# Patient Record
Sex: Male | Born: 1990 | Race: White | Hispanic: No | Marital: Single | Smoking: Former smoker
Health system: Southern US, Community
[De-identification: ages and names within clinical notes are randomized; demographics above are authoritative.]

## PROBLEM LIST (undated history)

## (undated) HISTORY — PX: NO PAST SURGERIES: SHX2092

## (undated) HISTORY — PX: APPENDECTOMY: SHX54

---
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
## 2014-10-31 DIAGNOSIS — R51 Headache: Secondary | ICD-10-CM

## 2014-10-31 DIAGNOSIS — R519 Headache, unspecified: Secondary | ICD-10-CM | POA: Insufficient documentation

## 2014-12-26 DIAGNOSIS — G43109 Migraine with aura, not intractable, without status migrainosus: Secondary | ICD-10-CM | POA: Insufficient documentation

## 2014-12-26 DIAGNOSIS — R55 Syncope and collapse: Secondary | ICD-10-CM | POA: Insufficient documentation

## 2014-12-28 ENCOUNTER — Encounter: Payer: Self-pay | Admitting: Family Medicine

## 2014-12-28 ENCOUNTER — Ambulatory Visit (INDEPENDENT_AMBULATORY_CARE_PROVIDER_SITE_OTHER): Payer: 59 | Admitting: Family Medicine

## 2014-12-28 VITALS — BP 90/60 | HR 78 | Temp 98.2°F | Resp 19 | Wt 140.2 lb

## 2014-12-28 DIAGNOSIS — Z024 Encounter for examination for driving license: Secondary | ICD-10-CM

## 2014-12-28 DIAGNOSIS — L409 Psoriasis, unspecified: Secondary | ICD-10-CM | POA: Diagnosis not present

## 2014-12-28 NOTE — Patient Instructions (Signed)
Psoriasis Psoriasis is a common, long-lasting (chronic) inflammation of the skin. It affects both men and women equally, of all ages and all races. Psoriasis cannot be passed from person to person (not contagious). Psoriasis varies from mild to very severe. When severe, it can greatly affect your quality of life. Psoriasis is an inflammatory disorder affecting the skin as well as other organs including the joints (causing an arthritis). With psoriasis, the skin sheds its top layer of cells more rapidly than it does in someone without psoriasis. CAUSES  The cause of psoriasis is largely unknown. Genetics, your immune system, and the environment seem to play a role in causing psoriasis. Factors that can make psoriasis worse include:  Damage or trauma to the skin, such as cuts, scrapes, and sunburn. This damage often causes new areas of psoriasis (lesions).  Winter dryness and lack of sunlight.  Medicines such as lithium, beta-blockers, antimalarial drugs, ACE inhibitors, nonsteroidal anti-inflammatory drugs (ibuprofen, aspirin), and terbinafine. Let your caregiver know if you are taking any of these drugs.  Alcohol. Excessive alcohol use should be avoided if you have psoriasis. Drinking large amounts of alcohol can affect:  How well your psoriasis treatment works.  How safe your psoriasis treatment is.  Smoking. If you smoke, ask your caregiver for help to quit.  Stress.  Bacterial or viral infections.  Arthritis. Arthritis associated with psoriasis (psoriatic arthritis) affects less than 10% of patients with psoriasis. The arthritic intensity does not always match the skin psoriasis intensity. It is important to let your caregiver know if your joints hurt or if they are stiff. SYMPTOMS  The most common form of psoriasis begins with little red bumps that gradually become larger. The bumps begin to form scales that flake off easily. The lower layers of scales stick together. When these scales  are scratched or removed, the underlying skin is tender and bleeds easily. These areas then grow in size and may become large. Psoriasis often creates a rash that looks the same on both sides of the body (symmetrical). It often affects the elbows, knees, groin, genitals, arms, legs, scalp, and nails. Affected nails often have pitting, loosen, thicken, crumble, and are difficult to treat.  "Inverse psoriasis"occurs in the armpits, under breasts, in skin folds, and around the groin, buttocks, and genitals.  "Guttate psoriasis" generally occurs in children and young adults following a recent sore throat (strep throat). It begins with many small, red, scaly spots on the skin. It clears spontaneously in weeks or a few months without treatment. DIAGNOSIS  Psoriasis is diagnosed by physical exam. A tissue sample (biopsy) may also be taken. TREATMENT The treatment of psoriasis depends on your age, health, and living conditions.  Steroid (cortisone) creams, lotions, and ointments may be used. These treatments are associated with thinning of the skin, blood vessels that get larger (dilated), loss of skin pigmentation, and easy bruising. It is important to use these steroids as directed by your caregiver. Only treat the affected areas and not the normal, unaffected skin. People on long-term steroid treatment should wear a medical alert bracelet. Injections may be used in areas that are difficult to treat.  Scalp treatments are available as shampoos, solutions, sprays, foams, and oils. Avoid scratching the scalp and picking at the scales.  Anthralin medicine works well on areas that are difficult to treat. However, it stains clothes and skin and may cause temporary irritation.  Synthetic vitamin D (calcipotriene)can be used on small areas. It is available by prescription. The forms   of synthetic vitamin D available in health food stores do not help with psoriasis.  Coal tarsare available in various strengths  for psoriasis that is difficult to treat. They are one of the longest used treatments for difficult to treat psoriasis. However, they are messy to use.  Light therapy (UV therapy) can be carefully and professionally monitored in a dermatologist's office. Careful sunbathing is helpful for many people as directed by your caregiver. The exposure should be just long enough to cause a mild redness (erythema) of your skin. Avoid sunburn as this may make the condition worse. Sunscreen (SPF of 30 or higher) should be used to protect against sunburn. Cataracts, wrinkles, and skin aging are some of the harmful side effects of light therapy.  If creams (topical medicines) fail, there are several other options for systemic or oral medicines your caregiver can suggest. Psoriasis can sometimes be very difficult to treat. It can come and go. It is necessary to follow up with your caregiver regularly if your psoriasis is difficult to treat. Usually, with persistence you can get a good amount of relief. Maintaining consistent care is important. Do not change caregivers just because you do not see immediate results. It may take several trials to find the right combination of treatment for you. PREVENTING FLARE-UPS  Wear gloves while you wash dishes, while cleaning, and when you are outside in the cold.  If you have radiators, place a bowl of water or damp towel on the radiator. This will help put water back in the air. You can also use a humidifier to keep the air moist. Try to keep the humidity at about 60% in your home.  Apply moisturizer while your skin is still damp from bathing or showering. This traps water in the skin.  Avoid long, hot baths or showers. Keep soap use to a minimum. Soaps dry out the skin and wash away the protective oils. Use a fragrance free, dye free soap.  Drink enough water and fluids to keep your urine clear or pale yellow. Not drinking enough water depletes your skin's water  supply.  Turn off the heat at night and keep it low during the day. Cool air is less drying. SEEK MEDICAL CARE IF:  You have increasing pain in the affected areas.  You have uncontrolled bleeding in the affected areas.  You have increasing redness or warmth in the affected areas.  You start to have pain or stiffness in your joints.  You start feeling depressed about your condition.  You have a fever. Document Released: 04/25/2000 Document Revised: 07/21/2011 Document Reviewed: 10/21/2010 ExitCare Patient Information 2015 [employer]. This information is not intended to replace advice given to you by your health care provider. Make sure you discuss any questions you have with your health care provider.  
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
## 2018-08-16 ENCOUNTER — Telehealth: Payer: Self-pay | Admitting: *Deleted

## 2018-08-16 ENCOUNTER — Ambulatory Visit: Payer: Self-pay | Admitting: Family Medicine

## 2018-08-16 NOTE — Telephone Encounter (Signed)
States his exposure was when [place] and found out one of the girls was tested positive for COVID-19 on 08-12-18. He was advised to isolate for 14 days. He has not had any fever, cough or shortness of breath since his exposure 8 days ago. States his employer is very anxious and wanted him tested. He does not meet the criteria for testing but should continue isolation and use a face covering when he leaves his home.

## 2018-08-16 NOTE — Telephone Encounter (Signed)
In contact Sunday before with a group of 4.   [place] the [place] and were [place] a [place].   Later in the week one of the ladies on the boat with [place].    She got tested for coronavirus and is positive.  I have no symptoms.   I let my employer know I had been in contact with someone.   They want follow me to follow the CDC guidelines and now they want me to get tested.  When I asked him about who he sees for his PCP I noticed it was Dortha Kern, PA with a different practice that we don't answer for.   He said he called [facility] and they gave him this number.   So I instructed him to call the practice he normally goes to with Dortha Kern, PA.   He said he would do that.     I did answer his questions and let him know about staying home and isolating for 14 days but he still needed to contact his PCP.   He verbalized understanding and was agreeable to all his PCP.

## 2018-08-16 NOTE — Telephone Encounter (Signed)
Patient would like Maurine Minister to call him concerning [phone] testing. Patient states he was exposed to the virus and now his employer will not let him come back to work. Please advise? Cb# [phone]

## 2018-08-16 NOTE — Telephone Encounter (Signed)
Patient returned call. Patient was at home when exposed.

## 2018-08-16 NOTE — Telephone Encounter (Signed)
No answer. Left message to call back. With this history, he must stay home from work, use a face covering when leaving the home going to the grocery store and notify us if he is having fever above 100, cough and shortness of breath. He should wash his hands frequently for 20 seconds each time. Need to know if his exposure was at work or at home.

## 2018-08-23 ENCOUNTER — Telehealth: Payer: Self-pay

## 2018-08-23 ENCOUNTER — Other Ambulatory Visit: Payer: Self-pay

## 2018-08-23 ENCOUNTER — Ambulatory Visit: Payer: 59 | Admitting: Family Medicine

## 2018-08-23 ENCOUNTER — Encounter: Payer: Self-pay | Admitting: Family Medicine

## 2018-08-23 VITALS — BP 110/72 | HR 96 | Temp 99.1°F | Resp 16 | Ht 68.0 in | Wt 145.0 lb

## 2018-08-23 DIAGNOSIS — Z20822 Contact with and (suspected) exposure to covid-19: Secondary | ICD-10-CM

## 2018-08-23 DIAGNOSIS — Z20828 Contact with and (suspected) exposure to other viral communicable diseases: Secondary | ICD-10-CM

## 2018-08-23 DIAGNOSIS — L409 Psoriasis, unspecified: Secondary | ICD-10-CM | POA: Diagnosis not present

## 2018-08-23 NOTE — Telephone Encounter (Signed)
Schedule appointment to document temperature since he has not been seen since 2016.

## 2018-08-23 NOTE — Telephone Encounter (Signed)
Patient called and stated that he needs a doctor notes for work. He states that the job told him he could not come back to work until he is cleared from a doctor due to not be able to get the COVID-19 test done.
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
## 2018-08-23 NOTE — Telephone Encounter (Signed)
Done

## 2019-03-03 ENCOUNTER — Emergency Department: Payer: 59

## 2019-03-03 ENCOUNTER — Encounter: Payer: Self-pay | Admitting: Emergency Medicine

## 2019-03-03 ENCOUNTER — Other Ambulatory Visit: Payer: Self-pay

## 2019-03-03 ENCOUNTER — Inpatient Hospital Stay
Admission: EM | Admit: 2019-03-03 | Discharge: 2019-03-05 | DRG: 494 | Disposition: A | Discharge status: Home / Self Care | Payer: 59 | Attending: Orthopedic Surgery | Admitting: Orthopedic Surgery

## 2019-03-03 DIAGNOSIS — Z87891 Personal history of nicotine dependence: Secondary | ICD-10-CM

## 2019-03-03 DIAGNOSIS — W109XXA Fall (on) (from) unspecified stairs and steps, initial encounter: Secondary | ICD-10-CM | POA: Diagnosis present

## 2019-03-03 DIAGNOSIS — S82831A Other fracture of upper and lower end of right fibula, initial encounter for closed fracture: Secondary | ICD-10-CM | POA: Diagnosis present

## 2019-03-03 DIAGNOSIS — S82241A Displaced spiral fracture of shaft of right tibia, initial encounter for closed fracture: Principal | ICD-10-CM | POA: Diagnosis present

## 2019-03-03 DIAGNOSIS — Y92009 Unspecified place in unspecified non-institutional (private) residence as the place of occurrence of the external cause: Secondary | ICD-10-CM | POA: Diagnosis not present

## 2019-03-03 DIAGNOSIS — M25539 Pain in unspecified wrist: Secondary | ICD-10-CM

## 2019-03-03 DIAGNOSIS — Z20828 Contact with and (suspected) exposure to other viral communicable diseases: Secondary | ICD-10-CM | POA: Diagnosis present

## 2019-03-03 DIAGNOSIS — S82201A Unspecified fracture of shaft of right tibia, initial encounter for closed fracture: Secondary | ICD-10-CM | POA: Diagnosis present

## 2019-03-03 DIAGNOSIS — Z419 Encounter for procedure for purposes other than remedying health state, unspecified: Secondary | ICD-10-CM

## 2019-03-03 DIAGNOSIS — S82451A Displaced comminuted fracture of shaft of right fibula, initial encounter for closed fracture: Secondary | ICD-10-CM | POA: Diagnosis present

## 2019-03-03 LAB — PROTIME-INR
INR: 1.1 (ref 0.8–1.2)
Prothrombin Time: 14.1 seconds (ref 11.4–15.2)

## 2019-03-03 LAB — COMPREHENSIVE METABOLIC PANEL
ALT: 15 U/L (ref 0–44)
AST: 20 U/L (ref 15–41)
Albumin: 4.5 g/dL (ref 3.5–5.0)
Alkaline Phosphatase: 54 U/L (ref 38–126)
Anion gap: 11 (ref 5–15)
BUN: 11 mg/dL (ref 6–20)
CO2: 26 mmol/L (ref 22–32)
Calcium: 9.2 mg/dL (ref 8.9–10.3)
Chloride: 106 mmol/L (ref 98–111)
Creatinine, Ser: 1.01 mg/dL (ref 0.61–1.24)
GFR calc Af Amer: >60 mL/min (ref >60)
GFR calc non Af Amer: >60 mL/min (ref >60)
Glucose, Bld: 124 mg/dL — ABNORMAL HIGH (ref 70–99)
Potassium: 3.7 mmol/L (ref 3.5–5.1)
Sodium: 143 mmol/L (ref 135–145)
Total Bilirubin: 0.4 mg/dL (ref 0.3–1.2)
Total Protein: 7.4 g/dL (ref 6.5–8.1)

## 2019-03-03 LAB — CBC WITH DIFFERENTIAL/PLATELET
Abs Immature Granulocytes: 0.03 10*3/uL (ref 0.00–0.07)
Basophils Absolute: 0.1 10*3/uL (ref 0.0–0.1)
Basophils Relative: 1 %
Eosinophils Absolute: 0.2 10*3/uL (ref 0.0–0.5)
Eosinophils Relative: 2 %
HCT: 40.7 % (ref 39.0–52.0)
Hemoglobin: 13.6 g/dL (ref 13.0–17.0)
Immature Granulocytes: 0 %
Lymphocytes Relative: 39 %
Lymphs Abs: 3.9 10*3/uL (ref 0.7–4.0)
MCH: 31.7 pg (ref 26.0–34.0)
MCHC: 33.4 g/dL (ref 30.0–36.0)
MCV: 94.9 fL (ref 80.0–100.0)
Monocytes Absolute: 0.8 10*3/uL (ref 0.1–1.0)
Monocytes Relative: 9 %
Neutro Abs: 4.9 10*3/uL (ref 1.7–7.7)
Neutrophils Relative %: 49 %
Platelets: 364 10*3/uL (ref 150–400)
RBC: 4.29 MIL/uL (ref 4.22–5.81)
RDW: 12.1 % (ref 11.5–15.5)
WBC: 9.9 10*3/uL (ref 4.0–10.5)
nRBC: 0 % (ref 0.0–0.2)

## 2019-03-03 LAB — TYPE AND SCREEN
ABO/RH(D): A POS
Antibody Screen: NEGATIVE

## 2019-03-03 LAB — SARS CORONAVIRUS 2 BY RT PCR (HOSPITAL ORDER, PERFORMED IN ~~LOC~~ HOSPITAL LAB): SARS Coronavirus 2: NEGATIVE

## 2019-03-03 MED ORDER — SODIUM CHLORIDE 0.9 % IV BOLUS
1000.0000 mL | Freq: Once | INTRAVENOUS | Status: AC
  Administered 2019-03-03: 1000 mL via INTRAVENOUS

## 2019-03-03 MED ORDER — MORPHINE SULFATE (PF) 2 MG/ML IV SOLN
2.0000 mg | Freq: Once | INTRAVENOUS | Status: AC
  Administered 2019-03-03: 2 mg via INTRAVENOUS
  Filled 2019-03-03: qty 1

## 2019-03-03 MED ORDER — ONDANSETRON HCL 4 MG/2ML IJ SOLN
4.0000 mg | Freq: Once | INTRAMUSCULAR | Status: AC
  Administered 2019-03-03: 4 mg via INTRAVENOUS
  Filled 2019-03-03: qty 2

## 2019-03-03 MED ORDER — OXYCODONE-ACETAMINOPHEN 5-325 MG PO TABS
1.0000 | ORAL_TABLET | Freq: Once | ORAL | Status: DC
  Filled 2019-03-03: qty 1

## 2019-03-03 MED ORDER — MORPHINE SULFATE (PF) 4 MG/ML IV SOLN
4.0000 mg | Freq: Once | INTRAVENOUS | Status: AC
  Administered 2019-03-03: 4 mg via INTRAVENOUS
  Filled 2019-03-03: qty 1

## 2019-03-03 MED ORDER — TETANUS-DIPHTH-ACELL PERTUSSIS 5-2.5-18.5 LF-MCG/0.5 IM SUSP
0.5000 mL | Freq: Once | INTRAMUSCULAR | Status: AC
  Administered 2019-03-03: 0.5 mL via INTRAMUSCULAR
  Filled 2019-03-03: qty 0.5

## 2019-03-03 MED ORDER — OXYCODONE-ACETAMINOPHEN 5-325 MG PO TABS: 1.0000 | ORAL_TABLET | Freq: Four times a day (QID) | ORAL | 0 refills | Status: DC | PRN

## 2019-03-03 NOTE — ED Notes (Signed)
Report off to rebecca rn   Pt moved to room 30.

## 2019-03-03 NOTE — Consult Note (Signed)
Full consult note to follow. Plan right tibia intramedullary fixation Friday afternoon based on OR availability. Pain control, monitor neurovascular status and NPO.

## 2019-03-03 NOTE — ED Provider Notes (Signed)
[facility] Emergency Department Provider Note  ____________________________________________  Time seen: Approximately 9:53 PM  I have reviewed the triage vital signs and the nursing notes.   HISTORY  Chief Complaint Leg Injury and Foot Injury    HPI Dale Harmon is a 28 y.o. male who presents the emergency department complaining of right ankle injury.  Patient reports  that he was partying with some of his friends tonight when he was walking down a stairway, missed a step, that his ankle "snapped."  Patient reports that he fell landing on his right shoulder.  Patient states that he had intense pain in his ankle but he tried to stand up, and then realized that his foot was canted over to the right while his leg was straight up.  Patient was trying to move and he noticed that his lower leg was moving while his foot was not.  At that point patient did not try to get up and was assisted to a vehicle by his friends and brought to the emergency department.  Patient reports that he has had some alcohol tonight as well as a full meal approximately 3-1/2 hours prior to arrival.  He did not hit his head or lose consciousness.  Patient does have an abrasion reported to the right shoulder but denies any other injury other than ankle and abrasion.        History reviewed. No pertinent past medical history.  Patient Active Problem List   Diagnosis Date Noted  . Psoriasis 12/28/2014  . Acute onset aura migraine 12/26/2014  . Episode of syncope 12/26/2014  . Cephalalgia 10/31/2014    Past Surgical History:  Procedure Laterality Date  . NO PAST SURGERIES      Prior to Admission medications   Medication Sig Start Date End Date Taking? Authorizing Provider  oxyCODONE-acetaminophen (PERCOCET/ROXICET) 5-325 MG tablet Take 1 tablet by mouth every 6 (six) hours as needed for severe pain. 03/03/19   Nak[place] Ganus, Charline Bills, PA-C  SUMAtriptan (IMITREX) 100 MG tablet Take 1/2  to 1 tablet at headache onset. May take a second dose after 2 hours if needed. 10/31/14   [provider]    Allergies Amoxicillin and Penicillins  Family History  Problem Relation Age of Onset  . Healthy Mother   . Healthy Father     Social History Social History   Tobacco Use  . Smoking status: Former Smoker    Quit date: 11/10/2014    Years since quitting: 4.3  . Smokeless tobacco: Never Used  Substance Use Topics  . Alcohol use: Yes    Alcohol/week: 0.0 standard drinks    Comment: MODERATE USE  . Drug use: No     Review of Systems  Constitutional: No fever/chills Eyes: No visual changes. No discharge ENT: No upper respiratory complaints. Cardiovascular: no chest pain. Respiratory: no cough. No SOB. Gastrointestinal: No abdominal pain.  No nausea, no vomiting.  Musculoskeletal: Positive for significant ankle injury Skin: Negative for rash, abrasions, lacerations, ecchymosis. Neurological: Negative for headaches, focal weakness or numbness. 10-point ROS otherwise negative.  ____________________________________________   PHYSICAL EXAM:  VITAL SIGNS: ED Triage Vitals  Enc Vitals Group     BP      Pulse      Resp      Temp      Temp src      SpO2      Weight      Height      Head Circumference  Peak Flow      Pain Score      Pain Loc      Pain Edu?      Excl. in GC?      Constitutional: Alert and oriented. Well appearing and in no acute distress. Eyes: Conjunctivae are normal. PERRL. EOMI. Head: Atraumatic. Neck: No stridor.  No cervical spine tenderness to palpation.  Cardiovascular: Normal rate, regular rhythm. Normal S1 and S2.  Good peripheral circulation. Respiratory: Normal respiratory effort without tachypnea or retractions. Lungs CTAB. Good air entry to the bases with no decreased or absent breath sounds. Musculoskeletal: Full range of motion to all extremities. No gross deformities appreciated.  Visualization of the right ankle  reveals obvious deformity.  Patient right lower extremity is in anatomical position with the right foot canted over to the right.  Initially, palpation revealed that this foot was much cooler than the other extremity.  Throughout manipulation for imaging, patient did have slight traction placed on his foot and his foot became much warmer.  Patient reports that he has decreased sensation globally to the foot distal to the obvious deformity.  With any movement, whether movement by staff to place the patient in position for imaging or the patient shifting in the bed, obvious tenting of the skin occurs proximal to the ankle joint.  Patient has exquisite pain in this region and has exquisite tenderness to any palpation.  Dorsalis pedis pulse is appreciated and marked with indelible marker. Neurologic:  Normal speech and language. No gross focal neurologic deficits are appreciated.  Skin:  Skin is warm, dry and intact. No rash noted. Psychiatric: Mood and affect are normal. Speech and behavior are normal. Patient exhibits appropriate insight and judgement.   ____________________________________________   LABS (all labs ordered are listed, but only abnormal results are displayed)  Labs Reviewed  COMPREHENSIVE METABOLIC PANEL - Abnormal; Notable for the following components:      Result Value   Glucose, Bld 124 (*)    All other components within normal limits  SARS CORONAVIRUS 2 BY RT PCR (HOSPITAL ORDER, PERFORMED IN [facility])  CBC WITH DIFFERENTIAL/PLATELET  PROTIME-INR  TYPE AND SCREEN   ____________________________________________  EKG   ____________________________________________  RADIOLOGY I personally viewed and evaluated these images as part of my medical decision making, as well as reviewing the written report by the radiologist.  Dg Chest 1 View  Result Date: 03/03/2019 CLINICAL DATA:  Ankle injury EXAM: CHEST  1 VIEW COMPARISON:  None. FINDINGS: Rotated patient.  No focal airspace disease or pleural effusion. Heart size within normal limits. No pneumothorax. IMPRESSION: No active disease.  Rotated patient Electronically Signed   By: Jasmine PangKim  Fujinaga M.D.   On: 03/03/2019 22:34   Dg Tibia/fibula Right  Result Date: 03/03/2019 CLINICAL DATA:  Injury EXAM: RIGHT TIBIA AND FIBULA - 2 VIEW COMPARISON:  None. FINDINGS: Acute comminuted fracture involving the proximal shaft of the fibula at the junction of the proximal and middle thirds. 1/2 shaft diameter medial and posterior displacement of the main fracture fragment. Incompletely visualized displaced spiral fracture of the distal tibia. IMPRESSION: 1. Acute comminuted and displaced fracture involving proximal shaft of the fibula 2. Incompletely visualized displaced fracture of the distal tibia Electronically Signed   By: Jasmine PangKim  Fujinaga M.D.   On: 03/03/2019 22:35   Dg Ankle Complete Right  Result Date: 03/03/2019 CLINICAL DATA:  Injury EXAM: RIGHT ANKLE - COMPLETE 3+ VIEW COMPARISON:  None. FINDINGS: Incompletely visualized fracture of the proximal  to mid shaft of fibula. Spiral fracture distal shaft of the tibia with slightly greater than 1/2 shaft diameter lateral, and about 1/4 shaft diameter of anterior displacement of distal fracture fragment. IMPRESSION: Acute displaced spiral fracture of the distal tibia. Incompletely visualized fracture involving the proximal to midshaft of the fibula Electronically Signed   By: Jasmine Pang M.D.   On: 03/03/2019 22:34    ____________________________________________    PROCEDURES  Procedure(s) performed:    .Splint Application  Date/Time: 03/03/2019 10:32 PM Performed by: Racheal Patches, PA-C Authorized by: Racheal Patches, PA-C   Consent:    Consent obtained:  Verbal   Consent given by:  Patient   Risks discussed:  Pain, swelling and numbness Pre-procedure details:    Sensation:  Numbness (Comparing sensation between the right lower extremity  left lower extremity.  Patient is able to fill each digit individually, however there is decreased sensation when compared with unaffected extremity.  ) Procedure details:    Laterality:  Right   Location:  Ankle   Ankle:  R ankle   Splint type:  Short leg and ankle stirrup   Supplies:  Cotton padding, Ortho-Glass and elastic bandage Post-procedure details:    Pain:  Improved   Sensation:  Numbness (As noted above, slight decrease in sensation on the right foot when compared with left.  Patient is still able to fill each individual toe with palpation.  No change from prior to splint application.)   Patient tolerance of procedure:  Tolerated well, no immediate complications      Medications  oxyCODONE-acetaminophen (PERCOCET/ROXICET) 5-325 MG per tablet 1 tablet (has no administration in time range)  Tdap (BOOSTRIX) injection 0.5 mL (0.5 mLs Intramuscular Given 03/03/19 2221)  morphine 4 MG/ML injection 4 mg (4 mg Intravenous Given 03/03/19 2223)  ondansetron (ZOFRAN) injection 4 mg (4 mg Intravenous Given 03/03/19 2223)  sodium chloride 0.9 % bolus 1,000 mL (1,000 mLs Intravenous New Bag/Given 03/03/19 2223)     ____________________________________________   INITIAL IMPRESSION / ASSESSMENT AND PLAN / ED COURSE  Pertinent labs & imaging results that were available during my care of the patient were reviewed by me and considered in my medical decision making (see chart for details).  Review of the [place] CSRS was performed in accordance of the [facility] prior to dispensing any controlled drugs.           Patient's diagnosis is consistent with fall resulting in comminuted fibular fracture and displaced spiral fracture of the tibia.  Patient presented to the emergency department after mechanical fall causing an injury to his right lower extremity. Initial presentation to the room, patient had an obvious deformity with an obviously unstable fracture.  Patient moved, his foot remained canted  to the right while his proximal tibial region moved independently.  Initially patient's foot was cool to the touch.  However during positioning for imaging, patient stated that it "felt better" as well as starting to warm up.  Patient has had a good dorsalis pedis pulse and good capillary refill since this time.  Imaging revealed the above fractures.  Patient was splinted with posterior OCL and stirrup ankle splint.  Patient has been given IV pain medications here in the emergency department.  Discussed the case with on-call orthopedic surgeon.  Patient was advised that he could either be admitted and have surgery tomorrow late in the afternoon or schedule outpatient follow-up first thing Monday for surgery.  Patient opted to go home tonight, will follow up  with orthopedic surgery first thing Monday morning to schedule surgery.  Return precautions are discussed with the patient.  Patient is prescribed Percocet for pain.  Patient is given ED precautions to return to the ED for any worsening or new symptoms.     ____________________________________________  FINAL CLINICAL IMPRESSION(S) / ED DIAGNOSES  Final diagnoses:  Closed displaced spiral fracture of shaft of right tibia, initial encounter  Closed displaced comminuted fracture of shaft of right fibula, initial encounter      NEW MEDICATIONS STARTED DURING THIS VISIT:  ED Discharge Orders         Ordered    oxyCODONE-acetaminophen (PERCOCET/ROXICET) 5-325 MG tablet  Every 6 hours PRN,   Status:  Discontinued     03/03/19 2320    oxyCODONE-acetaminophen (PERCOCET/ROXICET) 5-325 MG tablet  Every 6 hours PRN     03/03/19 2322              This chart was dictated using voice recognition software/Dragon. Despite best efforts to proofread, errors can occur which can change the meaning. Any change was purely unintentional.    Racheal Patches, PA-C 03/03/19 2325    Phineas Semen, MD 03/07/19 [phone]

## 2019-03-03 NOTE — ED Triage Notes (Signed)
Pt with obvious deformity to the right lower leg/ankle post fall down stairs. Pt admits to ETOH. Pt is A&O x4 on arrival. PA at the bedside.

## 2019-03-03 NOTE — ED Notes (Signed)
Pt reports twisted right ankle going down the steps tonight.  Pt felt it pop.  Obvious deformity noted.   Pt states etoh use tonight.   Good distal pulses.  Pt alert.

## 2019-03-03 NOTE — ED Notes (Signed)
ocl splint applied to right ankle by pac and rn.  Iv infusing and meds given.  Labs sent.

## 2019-03-04 ENCOUNTER — Inpatient Hospital Stay: Payer: 59 | Admitting: Anesthesiology

## 2019-03-04 ENCOUNTER — Encounter
Admission: EM | Disposition: A | Discharge status: Home / Self Care | Payer: Self-pay | Source: Home / Self Care | Attending: Orthopedic Surgery

## 2019-03-04 ENCOUNTER — Inpatient Hospital Stay: Payer: 59

## 2019-03-04 ENCOUNTER — Encounter: Payer: Self-pay | Admitting: *Deleted

## 2019-03-04 HISTORY — PX: TIBIA IM NAIL INSERTION: SHX2516

## 2019-03-04 LAB — MRSA PCR SCREENING: MRSA by PCR: NEGATIVE

## 2019-03-04 LAB — HIV ANTIBODY (ROUTINE TESTING W REFLEX): HIV Screen 4th Generation wRfx: NONREACTIVE

## 2019-03-04 SURGERY — INSERTION, INTRAMEDULLARY ROD, TIBIA
Anesthesia: General | Site: Leg Lower | Laterality: Right

## 2019-03-04 MED ORDER — ROPIVACAINE HCL 5 MG/ML IJ SOLN
INTRAMUSCULAR | Status: AC
  Filled 2019-03-04: qty 30

## 2019-03-04 MED ORDER — MIDAZOLAM HCL 2 MG/2ML IJ SOLN
INTRAMUSCULAR | Status: DC | PRN
  Administered 2019-03-04: 2 mg via INTRAVENOUS

## 2019-03-04 MED ORDER — DOCUSATE SODIUM 100 MG PO CAPS
100.0000 mg | ORAL_CAPSULE | Freq: Two times a day (BID) | ORAL | Status: DC
  Administered 2019-03-04 – 2019-03-05 (×3): 100 mg via ORAL
  Filled 2019-03-04 (×4): qty 1

## 2019-03-04 MED ORDER — SODIUM CHLORIDE 0.9 % IR SOLN
Status: DC | PRN
  Administered 2019-03-04: 16:00:00

## 2019-03-04 MED ORDER — ACETAMINOPHEN 10 MG/ML IV SOLN
INTRAVENOUS | Status: DC | PRN
  Administered 2019-03-04: 1000 mg via INTRAVENOUS

## 2019-03-04 MED ORDER — ONDANSETRON HCL 4 MG/2ML IJ SOLN: 4.0000 mg | Freq: Four times a day (QID) | INTRAMUSCULAR | Status: DC | PRN

## 2019-03-04 MED ORDER — FENTANYL CITRATE (PF) 100 MCG/2ML IJ SOLN
25.0000 ug | INTRAMUSCULAR | Status: DC | PRN
  Administered 2019-03-04 (×2): 25 ug via INTRAVENOUS
  Administered 2019-03-04: 50 ug via INTRAVENOUS

## 2019-03-04 MED ORDER — ONDANSETRON HCL 4 MG PO TABS: 4.0000 mg | ORAL_TABLET | Freq: Four times a day (QID) | ORAL | Status: DC | PRN

## 2019-03-04 MED ORDER — KETOROLAC TROMETHAMINE 15 MG/ML IJ SOLN
15.0000 mg | Freq: Four times a day (QID) | INTRAMUSCULAR | Status: AC
  Administered 2019-03-04 (×3): 15 mg via INTRAVENOUS
  Filled 2019-03-04 (×3): qty 1

## 2019-03-04 MED ORDER — FENTANYL CITRATE (PF) 100 MCG/2ML IJ SOLN
INTRAMUSCULAR | Status: DC | PRN
  Administered 2019-03-04 (×4): 50 ug via INTRAVENOUS

## 2019-03-04 MED ORDER — PROPOFOL 10 MG/ML IV BOLUS
INTRAVENOUS | Status: AC
  Filled 2019-03-04: qty 40

## 2019-03-04 MED ORDER — MORPHINE SULFATE (PF) 2 MG/ML IV SOLN
0.5000 mg | INTRAVENOUS | Status: DC | PRN
  Administered 2019-03-04: 1 mg via INTRAVENOUS
  Filled 2019-03-04: qty 1

## 2019-03-04 MED ORDER — DEXMEDETOMIDINE HCL 200 MCG/2ML IV SOLN
INTRAVENOUS | Status: DC | PRN
  Administered 2019-03-04 (×2): 8 ug via INTRAVENOUS

## 2019-03-04 MED ORDER — FENTANYL CITRATE (PF) 100 MCG/2ML IJ SOLN
INTRAMUSCULAR | Status: AC
  Filled 2019-03-04: qty 2

## 2019-03-04 MED ORDER — METHOCARBAMOL 500 MG PO TABS
500.0000 mg | ORAL_TABLET | Freq: Four times a day (QID) | ORAL | Status: DC | PRN
  Administered 2019-03-05: 500 mg via ORAL
  Filled 2019-03-04: qty 1

## 2019-03-04 MED ORDER — METOCLOPRAMIDE HCL 10 MG PO TABS: 5.0000 mg | ORAL_TABLET | Freq: Three times a day (TID) | ORAL | Status: DC | PRN

## 2019-03-04 MED ORDER — ACETAMINOPHEN 500 MG PO TABS
500.0000 mg | ORAL_TABLET | Freq: Four times a day (QID) | ORAL | Status: AC
  Administered 2019-03-04 (×2): 500 mg via ORAL
  Filled 2019-03-04 (×2): qty 1

## 2019-03-04 MED ORDER — ROPIVACAINE HCL 5 MG/ML IJ SOLN
INTRAMUSCULAR | Status: DC | PRN
  Administered 2019-03-04: 25 mL via EPIDURAL
  Administered 2019-03-04: 15 mL via EPIDURAL

## 2019-03-04 MED ORDER — HYDROCODONE-ACETAMINOPHEN 7.5-325 MG PO TABS
1.0000 | ORAL_TABLET | ORAL | Status: DC | PRN
  Administered 2019-03-05: 1 via ORAL
  Administered 2019-03-05: 2 via ORAL
  Filled 2019-03-04: qty 2
  Filled 2019-03-04: qty 1

## 2019-03-04 MED ORDER — FENTANYL CITRATE (PF) 100 MCG/2ML IJ SOLN
INTRAMUSCULAR | Status: AC
  Administered 2019-03-04: 25 ug via INTRAVENOUS
  Filled 2019-03-04: qty 2

## 2019-03-04 MED ORDER — LIDOCAINE HCL (PF) 1 % IJ SOLN
INTRAMUSCULAR | Status: DC | PRN
  Administered 2019-03-04 (×2): 5 mL

## 2019-03-04 MED ORDER — ONDANSETRON HCL 4 MG/2ML IJ SOLN
INTRAMUSCULAR | Status: DC | PRN
  Administered 2019-03-04: 4 mg via INTRAVENOUS

## 2019-03-04 MED ORDER — LIDOCAINE HCL (CARDIAC) PF 100 MG/5ML IV SOSY
PREFILLED_SYRINGE | INTRAVENOUS | Status: DC | PRN
  Administered 2019-03-04: 60 mg via INTRAVENOUS

## 2019-03-04 MED ORDER — MAGNESIUM CITRATE PO SOLN
1.0000 | Freq: Once | ORAL | Status: DC | PRN
  Filled 2019-03-04: qty 296

## 2019-03-04 MED ORDER — DEXAMETHASONE SODIUM PHOSPHATE 10 MG/ML IJ SOLN
INTRAMUSCULAR | Status: DC | PRN
  Administered 2019-03-04: 10 mg via INTRAVENOUS

## 2019-03-04 MED ORDER — ROCURONIUM BROMIDE 50 MG/5ML IV SOLN
INTRAVENOUS | Status: AC
  Filled 2019-03-04: qty 1

## 2019-03-04 MED ORDER — MAGNESIUM HYDROXIDE 400 MG/5ML PO SUSP: 30.0000 mL | Freq: Every day | ORAL | Status: DC | PRN

## 2019-03-04 MED ORDER — MIDAZOLAM HCL 2 MG/2ML IJ SOLN
INTRAMUSCULAR | Status: AC
  Filled 2019-03-04: qty 2

## 2019-03-04 MED ORDER — ROCURONIUM BROMIDE 100 MG/10ML IV SOLN
INTRAVENOUS | Status: DC | PRN
  Administered 2019-03-04: 20 mg via INTRAVENOUS
  Administered 2019-03-04: 25 mg via INTRAVENOUS
  Administered 2019-03-04: 5 mg via INTRAVENOUS
  Administered 2019-03-04: 10 mg via INTRAVENOUS
  Administered 2019-03-04: 20 mg via INTRAVENOUS

## 2019-03-04 MED ORDER — CLINDAMYCIN PHOSPHATE 900 MG/50ML IV SOLN
900.0000 mg | INTRAVENOUS | Status: AC
  Administered 2019-03-04: 900 mg via INTRAVENOUS
  Filled 2019-03-04: qty 50

## 2019-03-04 MED ORDER — LIDOCAINE HCL (PF) 1 % IJ SOLN
INTRAMUSCULAR | Status: AC
  Filled 2019-03-04: qty 10

## 2019-03-04 MED ORDER — MORPHINE SULFATE (PF) 2 MG/ML IV SOLN: 0.5000 mg | INTRAVENOUS | Status: DC | PRN

## 2019-03-04 MED ORDER — LACTATED RINGERS IV SOLN
INTRAVENOUS | Status: DC
  Administered 2019-03-04 (×4): via INTRAVENOUS

## 2019-03-04 MED ORDER — DOCUSATE SODIUM 100 MG PO CAPS: 100.0000 mg | ORAL_CAPSULE | Freq: Two times a day (BID) | ORAL | Status: DC

## 2019-03-04 MED ORDER — SUCCINYLCHOLINE CHLORIDE 20 MG/ML IJ SOLN
INTRAMUSCULAR | Status: DC | PRN
  Administered 2019-03-04: 120 mg via INTRAVENOUS

## 2019-03-04 MED ORDER — SUGAMMADEX SODIUM 200 MG/2ML IV SOLN
INTRAVENOUS | Status: DC | PRN
  Administered 2019-03-04: 200 mg via INTRAVENOUS

## 2019-03-04 MED ORDER — METHOCARBAMOL 1000 MG/10ML IJ SOLN
500.0000 mg | Freq: Four times a day (QID) | INTRAVENOUS | Status: DC | PRN
  Filled 2019-03-04: qty 5

## 2019-03-04 MED ORDER — ACETAMINOPHEN 325 MG PO TABS: 325.0000 mg | ORAL_TABLET | Freq: Four times a day (QID) | ORAL | Status: DC | PRN

## 2019-03-04 MED ORDER — SENNA 8.6 MG PO TABS
1.0000 | ORAL_TABLET | Freq: Two times a day (BID) | ORAL | Status: DC
  Administered 2019-03-04 – 2019-03-05 (×3): 8.6 mg via ORAL
  Filled 2019-03-04 (×4): qty 1

## 2019-03-04 MED ORDER — HYDROCODONE-ACETAMINOPHEN 5-325 MG PO TABS
1.0000 | ORAL_TABLET | ORAL | Status: DC | PRN
  Administered 2019-03-04: 1 via ORAL
  Filled 2019-03-04: qty 1

## 2019-03-04 MED ORDER — METOCLOPRAMIDE HCL 5 MG/ML IJ SOLN: 5.0000 mg | Freq: Three times a day (TID) | INTRAMUSCULAR | Status: DC | PRN

## 2019-03-04 MED ORDER — PROPOFOL 10 MG/ML IV BOLUS
INTRAVENOUS | Status: DC | PRN
  Administered 2019-03-04: 170 mg via INTRAVENOUS
  Administered 2019-03-04: 30 mg via INTRAVENOUS

## 2019-03-04 MED ORDER — BISACODYL 10 MG RE SUPP: 10.0000 mg | Freq: Every day | RECTAL | Status: DC | PRN

## 2019-03-04 MED ORDER — LACTATED RINGERS IV SOLN: INTRAVENOUS | Status: DC

## 2019-03-04 MED ORDER — BUPIVACAINE-EPINEPHRINE (PF) 0.25% -1:200000 IJ SOLN
INTRAMUSCULAR | Status: DC | PRN
  Administered 2019-03-04: 10 mL via PERINEURAL

## 2019-03-04 MED ORDER — LIDOCAINE HCL (PF) 2 % IJ SOLN
INTRAMUSCULAR | Status: AC
  Filled 2019-03-04: qty 10

## 2019-03-04 SURGICAL SUPPLY — 49 items
BIT DRILL CAL 3.2 LONG (BIT) ×1 IMPLANT
BIT DRILL CAL 3.2MM LONG (BIT) ×1
BIT DRILL SHORT 3.2MM (DRILL) IMPLANT
BNDG ELASTIC 4X5.8 VLCR STR LF (GAUZE/BANDAGES/DRESSINGS) ×2 IMPLANT
BRUSH SCRUB EZ  4% CHG (MISCELLANEOUS) ×2
BRUSH SCRUB EZ 4% CHG (MISCELLANEOUS) ×1 IMPLANT
CANISTER SUCT 1200ML W/VALVE (MISCELLANEOUS) ×3 IMPLANT
CANISTER SUCT 3000ML PPV (MISCELLANEOUS) ×3 IMPLANT
CHLORAPREP W/TINT 26 (MISCELLANEOUS) ×3 IMPLANT
COVER WAND RF STERILE (DRAPES) ×3 IMPLANT
DRAPE 3/4 80X56 (DRAPES) ×3 IMPLANT
DRAPE C-ARM XRAY 36X54 (DRAPES) ×3 IMPLANT
DRAPE C-ARMOR (DRAPES) ×2 IMPLANT
DRAPE U-SHAPE 47X51 STRL (DRAPES) ×3 IMPLANT
DRILL SHORT 3.2MM (DRILL) ×3
ELECT REM PT RETURN 9FT ADLT (ELECTROSURGICAL) ×3
ELECTRODE REM PT RTRN 9FT ADLT (ELECTROSURGICAL) ×1 IMPLANT
GAUZE SPONGE 4X4 12PLY STRL (GAUZE/BANDAGES/DRESSINGS) ×2 IMPLANT
GAUZE XEROFORM 1X8 LF (GAUZE/BANDAGES/DRESSINGS) ×3 IMPLANT
GLOVE BIO SURGEON STRL SZ8 (GLOVE) ×3 IMPLANT
GLOVE BIOGEL PI IND STRL 8.5 (GLOVE) ×1 IMPLANT
GLOVE BIOGEL PI INDICATOR 8.5 (GLOVE) ×2
GLOVE INDICATOR 8.0 STRL GRN (GLOVE) ×3 IMPLANT
GLOVE SURG ORTHO 8.0 STRL STRW (GLOVE) ×9 IMPLANT
GOWN STRL REUS W/ TWL LRG LVL3 (GOWN DISPOSABLE) ×2 IMPLANT
GOWN STRL REUS W/ TWL XL LVL3 (GOWN DISPOSABLE) ×1 IMPLANT
GOWN STRL REUS W/TWL LRG LVL3 (GOWN DISPOSABLE) ×4
GOWN STRL REUS W/TWL XL LVL3 (GOWN DISPOSABLE) ×2
GUIDEWIRE 3.2X400 (WIRE) ×2 IMPLANT
KIT TURNOVER KIT A (KITS) ×3 IMPLANT
MAT ABSORB  FLUID 56X50 GRAY (MISCELLANEOUS) ×2
MAT ABSORB FLUID 56X50 GRAY (MISCELLANEOUS) ×1 IMPLANT
NAIL TIB 8X330 (Nail) ×2 IMPLANT
NDL HYPO 25X1 1.5 SAFETY (NEEDLE) ×1 IMPLANT
NEEDLE HYPO 25X1 1.5 SAFETY (NEEDLE) ×3 IMPLANT
NS IRRIG 1000ML POUR BTL (IV SOLUTION) ×3 IMPLANT
PACK TOTAL KNEE (MISCELLANEOUS) ×3 IMPLANT
PAD ABD DERMACEA PRESS 5X9 (GAUZE/BANDAGES/DRESSINGS) ×2 IMPLANT
REAMER ROD DEEP FLUTE 2.5X950 (INSTRUMENTS) ×2 IMPLANT
SCREW LOCK 4X30 TI (Screw) ×2 IMPLANT
SCREW LOCK 4X44 TI (Screw) ×2 IMPLANT
SCREW LOCK TITANIUM 4.0X50 (Screw) ×2 IMPLANT
SCREW LOCKING 4.0 36MM (Screw) ×2 IMPLANT
SCREW LOCKING 4.0 40MM (Screw) ×2 IMPLANT
STAPLER SKIN PROX 35W (STAPLE) ×3 IMPLANT
SUT VIC AB 0 CT1 36 (SUTURE) ×3 IMPLANT
SUT VIC AB 2-0 CT1 18 (SUTURE) ×3 IMPLANT
SYR 10ML LL (SYRINGE) ×3 IMPLANT
TOWEL OR 17X26 4PK STRL BLUE (TOWEL DISPOSABLE) ×6 IMPLANT

## 2019-03-04 NOTE — H&P (Signed)
PREOPERATIVE H&P  Chief Complaint: tibial frature   HPI: Dale Harmon is a 28 y.o. male who presents for preoperative history and physical with a diagnosis of tibial frature . Symptoms are rated as moderate to severe, and have been worsening.  This is significantly impairing activities of daily living.  He has elected for surgical management.   History reviewed. No pertinent past medical history. Past Surgical History:  Procedure Laterality Date  . NO PAST SURGERIES     Social History   Socioeconomic History  . Marital status: Single    Spouse name: Not on file  . Number of children: Not on file  . Years of education: Not on file  . Highest education level: Not on file  Occupational History  . Not on file  Social Needs  . Financial resource strain: Not on file  . Food insecurity    Worry: Not on file    Inability: Not on file  . Transportation needs    Medical: Not on file    Non-medical: Not on file  Tobacco Use  . Smoking status: Former Smoker    Quit date: 11/10/2014    Years since quitting: 4.3  . Smokeless tobacco: Never Used  Substance and Sexual Activity  . Alcohol use: Yes    Alcohol/week: 0.0 standard drinks    Comment: MODERATE USE  . Drug use: No  . Sexual activity: Not on file  Lifestyle  . Physical activity    Days per week: Not on file    Minutes per session: Not on file  . Stress: Not on file  Relationships  . Social Musician on phone: Not on file    Gets together: Not on file    Attends religious service: Not on file    Active member of club or organization: Not on file    Attends meetings of clubs or organizations: Not on file    Relationship status: Not on file  Other Topics Concern  . Not on file  Social History Narrative  . Not on file   Family History  Problem Relation Age of Onset  . Healthy Mother   . Healthy Father    Allergies  Allergen Reactions  . Amoxicillin Hives  . Penicillins    Prior to Admission  medications   Medication Sig Start Date End Date Taking? Authorizing Provider  oxyCODONE-acetaminophen (PERCOCET/ROXICET) 5-325 MG tablet Take 1 tablet by mouth every 6 (six) hours as needed for severe pain. 03/03/19   Cuthriell, Delorise Royals, PA-C     Positive ROS: All other systems have been reviewed and were otherwise negative with the exception of those mentioned in the HPI and as above.  Physical Exam: General: Alert, no acute distress Cardiovascular: Regular rate and rhythm, no murmurs rubs or gallops.  No pedal edema Respiratory: Clear to auscultation bilaterally, no wheezes rales or rhonchi. No cyanosis, no use of accessory musculature GI: No organomegaly, abdomen is soft and non-tender nondistended with positive bowel sounds. Skin: Skin intact, no lesions within the operative field. Neurologic: Sensation intact distally Psychiatric: Patient is competent for consent with normal mood and affect Lymphatic: No axillary or cervical lymphadenopathy  MUSCULOSKELETAL: right leg externally rotated, good cap refill, moves toes, sensation intact, splint clean and dry  Assessment: tibial frature, right, closed, displaced   Plan: Plan for Procedure(s): INTRAMEDULLARY (IM) NAIL TIBIAL, RIGHT  I discussed the risks and benefits of surgery. The risks include but are not limited to infection, bleeding  requiring blood transfusion, nerve or blood vessel injury, joint stiffness or loss of motion, persistent pain, weakness or instability, malunion, nonunion and hardware failure and the need for further surgery. Medical risks include but are not limited to DVT and pulmonary embolism, myocardial infarction, stroke, pneumonia, respiratory failure and death. Patient understood these risks and wished to proceed.   Lovell Sheehan, MD   03/04/2019 5:00 PM

## 2019-03-04 NOTE — Anesthesia Procedure Notes (Addendum)
Procedure Name: Intubation Date/Time: 03/04/2019 5:23 PM Performed by: Dionne Bucy, CRNA Pre-anesthesia Checklist: Patient identified, Emergency Drugs available, Suction available and Patient being monitored Patient Re-evaluated:Patient Re-evaluated prior to induction Oxygen Delivery Method: Circle system utilized Preoxygenation: Pre-oxygenation with 100% oxygen Induction Type: IV induction Ventilation: Mask ventilation without difficulty Laryngoscope Size: McGraph and 3 Grade View: Grade I Tube type: Oral Tube size: 7.5 mm Number of attempts: 1 Airway Equipment and Method: Stylet and Video-laryngoscopy Placement Confirmation: ETT inserted through vocal cords under direct vision,  positive ETCO2 and breath sounds checked- equal and bilateral Secured at: 22 cm Tube secured with: Tape Dental Injury: Teeth and Oropharynx as per pre-operative assessment

## 2019-03-04 NOTE — Progress Notes (Signed)
Patient transported to OR.

## 2019-03-04 NOTE — TOC Initial Note (Signed)
Transition of Care [facility]) - Initial/Assessment Note    Patient Details  Name: Dale Harmon MRN: [id number] Date of Birth: [birth year]  [facility] CM/SW Contact:    Haldon Carley, Lenice Llamas Phone Number: [phone]  03/04/2019, 3:55 PM  Clinical Narrative: Clinical Social Worker (CSW) met with patient to discuss D/C plan. Patient's mother Alyse Low was at bedside. Patient was alert and oriented X4 and sitting up in the bed. Patient is a 28 y.o independent young man who works full time. Patient has health insurance through his employer. Patient reported that he fell down some stairs at a friend's house. Patient reported that he lives with her father in [place]. Patient plans to go home and anticipates needing crutches. CSW explained to patient that PT will evaluate him after surgery and make the DME recommendations. [employer] DME agency representative is aware of above. Patient reported no other needs or concerns. CSW will continue to follow and assist as needed.                 Expected Discharge Plan: Home/Self Care Barriers to Discharge: Continued Medical Work up   Patient Goals and CMS Choice Patient states their goals for this hospitalization and ongoing recovery are:: To go home.      Expected Discharge Plan and Services Expected Discharge Plan: Home/Self Care In-house Referral: Clinical Social Work     Living arrangements for the past 2 months: Single Family Home                 DME Arranged: Crutches DME Agency: [employer] Date DME Agency Contacted: 03/04/19 Time DME Agency Contacted: [phone] Representative spoke with at DME Agency: Brad Linne[place]: NA          Prior Living Arrangements/Services Living arrangements for the past 2 months: [place] Lives with:: Parents Patient language and need for interpreter reviewed:: No Do you feel safe going back to the place where you live?: Yes      Need for Family Participation in Patient Care:  No (Comment) Care giver support system in place?: No (comment)   Criminal Activity/Legal Involvement Pertinent to Current Situation/Hospitalization: No - Comment as needed  Activities of Daily Living Home Assistive Devices/Equipment: None ADL Screening (condition at time of admission) Patient's cognitive ability adequate to safely complete daily activities?: Yes Is the patient deaf or have difficulty hearing?: No Does the patient have difficulty seeing, even when wearing glasses/contacts?: No Does the patient have difficulty concentrating, remembering, or making decisions?: No Patient able to express need for assistance with ADLs?: Yes Does the patient have difficulty dressing or bathing?: No Independently performs ADLs?: Yes (appropriate for developmental age) Does the patient have difficulty walking or climbing stairs?: No Weakness of Legs: None Weakness of Arms/Hands: None  Permission Sought/Granted Permission sought to share information with : Other (comment)(DME agency) Permission granted to share information with : Yes, Verbal Permission Granted              Emotional Assessment Appearance:: Appears stated age Attitude/Demeanor/Rapport: Engaged Affect (typically observed): Calm, Pleasant Orientation: : Oriented to Self, Oriented to Place, Oriented to  Time, Oriented to Situation Alcohol / Substance Use: Not Applicable Psych Involvement: No (comment)  Admission diagnosis:  Closed displaced comminuted fracture of shaft of right fibula, initial encounter [S82.451A] Closed displaced spiral fracture of shaft of right tibia, initial encounter [V42.595G] Patient Active Problem List   Diagnosis Date Noted  . Fracture of tibial shaft, right, closed 03/03/2019  .  Psoriasis 12/28/2014  . Acute onset aura migraine 12/26/2014  . Episode of syncope 12/26/2014  . Cephalalgia 10/31/2014   PCP:  Margo Common, PA Pharmacy:   [id number] [place]: [phone]: [phone]    Social Determinants of Health (SDOH) Interventions    Readmission Risk Interventions No flowsheet data found.

## 2019-03-04 NOTE — Anesthesia Post-op Follow-up Note (Signed)
Anesthesia QCDR form completed.        

## 2019-03-04 NOTE — Op Note (Signed)
Expand All Collapse All     03/04/2019  7:43 PM  PATIENT:  Dale Harmon   MRN: [id number]  PRE-OPERATIVE DIAGNOSIS:  tibial frature   POST-OPERATIVE DIAGNOSIS:  tibial frature   PROCEDURE:  Procedure(s): INTRAMEDULLARY (IM) NAIL TIBIAL, RIGHT  SURGEON: Elyn Aquas. Harlow Mares, MD  ANESTHESIA: Spinal   PREOPERATIVE INDICATIONS: The patient  has a diagnosis of displaced and unstable tibia/ fibula fractures who elected for surgical management after discussion with the patient   about the options between surgery and cast management of the fractures.   The risks benefits and alternatives were discussed with the patient preoperatively including but not limited to the risks of infection, bleeding, nerve injury, cardiopulmonary complications, the need for revision surgery, among others, and the patient was willing to proceed.  OPERATIVE IMPLANTS: Synthes EX,   330 mm    8 mm  OPERATIVE FINDINGS: displaced, spiral, unstable fracture of the distal third of the tibia with angulation and displacement. Displaced comminuted fracture of the proximal fibula  COMPLICATIONS: None  EBL: 50 mL REPLACED: None  OPERATIVE PROCEDURE: The patient was brought to the operating room and underwent spinal anesthesia without complications and then placed on the operating room table and positioned appropriately. The operative leg was prepped and draped in a sterile fashion. Tourniquet was not used. IV anti-biotics were given. The leg was placed on the tibial reduction triangle and the foot immobilized with Coban and traction and rotational corrections were made. A proximal medial incision was made along the patellar tendon. Dissection was carried out bluntly through subcutaneous tissue and the fascia was divided. A guidepin was introduced into the proximal tibia under fluoroscopic control and seen to be in good alignment and position. Large drill was introduced to open the the proximal tibia. A  guidepin was then passed down the shaft of the tibia and across the fracture site down to the lower end of the tibia. The shaft was then sequentially reamed to  9.5 mm. The above listed Synthes nail was introduced and passed across the fracture site and fully seated in the tibia. Fluoroscopy showed excellent position of the nail and that the fracture had been well reduced. Length was excellent. Proximally, fixation was obtained with  two  5.0 screw(s).  Fluoroscopy showed good position and length. Distally, medial/ lateral screws were introduced and seated fully.Fluoroscopy showed good position and length. The wounds were then irrigated. The knee fascia was closed with 0 Vicryls and the subcutaneous tissue was closed with 2-0 Vicryls. All wounds were closed with staples. Xeroform covered by dressing sponges and asterile dressing was applied.Sponge and needle counts were correct. The patient was transferred to the hospital bed and taken to recovery room in good condition.  Elyn Aquas. Harlow Mares, MD

## 2019-03-04 NOTE — ED Notes (Signed)
Attempted to call report

## 2019-03-04 NOTE — Anesthesia Procedure Notes (Signed)
Anesthesia Regional Block: Popliteal block   Pre-Anesthetic Checklist: ,, timeout performed, Correct Patient, Correct Site, Correct Laterality, Correct Procedure, Correct Position, site marked, Risks and benefits discussed,  Surgical consent,  Pre-op evaluation,  At surgeon's request and post-op pain management  Laterality: Lower and Right  Prep: chloraprep       Needles:  Injection technique: Single-shot  Needle Type: Echogenic Needle     Needle Length: 9cm  Needle Gauge: 21     Additional Needles:   Procedures:,,,, ultrasound used (permanent image in chart),,,,  Narrative:  Start time: 03/04/2019 8:32 PM End time: 03/04/2019 8:57 PM Injection made incrementally with aspirations every 5 mL.  Performed by: Personally  Anesthesiologist: Martha Clan, MD  Additional Notes: Patient consented for risk and benefits of nerve block including but not limited to nerve damage, failed block, bleeding and infection.  Patient voiced understanding.  Functioning IV was confirmed and monitors were applied.  A echogenic needle was used. Sterile prep,hand hygiene and sterile gloves were used. Minimal sedation used for procedure.   No paresthesia endorsed by patient during the procedure.  Negative aspiration and negative test dose prior to incremental administration of local anesthetic. The patient tolerated the procedure well with no immediate complications.  An adductor canal block was performed in addition to the popliteal block to cover the rest of the lower leg.  This was done in the exact same fashion as the popliteal block.  Ultrasound was used and pictures are in the chart.  Patient tolerated the procedure well without paresthesias, negative aspiration, and negative test dose prior to incremental administration of local anesthetic.

## 2019-03-04 NOTE — Transfer of Care (Signed)
Immediate Anesthesia Transfer of Care Note  Patient: TEMPLE SPORER  Procedure(s) Performed: INTRAMEDULLARY (IM) NAIL TIBIAL, RIGHT (Right Leg Lower)  Patient Location: PACU  Anesthesia Type:General  Level of Consciousness: awake, drowsy and patient cooperative  Airway & Oxygen Therapy: Patient Spontanous Breathing  Post-op Assessment: Report given to RN and Post -op Vital signs reviewed and stable  Post vital signs: Reviewed and stable  Last Vitals:  Vitals Value Taken Time  BP 106/60 03/04/19 1939  Temp    Pulse 84 03/04/19 1941  Resp 11 03/04/19 1941  SpO2 98 % 03/04/19 1941  Vitals shown include unvalidated device data.  Last Pain:  Vitals:   03/04/19 1549  TempSrc:   PainSc: 7       Patients Stated Pain Goal: 1 (83/29/19 1660)  Complications: No apparent anesthesia complications

## 2019-03-04 NOTE — Anesthesia Postprocedure Evaluation (Signed)
Anesthesia Post Note  Patient: Dale Harmon  Procedure(s) Performed: INTRAMEDULLARY (IM) NAIL TIBIAL, RIGHT (Right Leg Lower)  Patient location during evaluation: PACU Anesthesia Type: General Level of consciousness: awake and alert Pain management: pain level controlled Vital Signs Assessment: post-procedure vital signs reviewed and stable Respiratory status: spontaneous breathing, nonlabored ventilation, respiratory function stable and patient connected to nasal cannula oxygen Cardiovascular status: blood pressure returned to baseline and stable Postop Assessment: no apparent nausea or vomiting Anesthetic complications: no     Last Vitals:  Vitals:   03/04/19 2114 03/04/19 2132  BP: 106/67 (!) 112/59  Pulse: 86 79  Resp: 14 18  Temp:  36.7 C  SpO2: 96% 97%    Last Pain:  Vitals:   03/04/19 2132  TempSrc: Oral  PainSc:                  Martha Clan

## 2019-03-04 NOTE — Anesthesia Preprocedure Evaluation (Signed)
Anesthesia Evaluation  Patient identified by MRN, date of birth, ID band Patient awake    Reviewed: Allergy & Precautions, H&P , NPO status , Patient's Chart, lab work & pertinent test results, reviewed documented beta blocker date and time   History of Anesthesia Complications Negative for: history of anesthetic complications  Airway Mallampati: I  TM Distance: >3 FB Neck ROM: full    Dental  (+) Dental Advidsory Given, Caps, Chipped, Teeth Intact   Pulmonary neg pulmonary ROS, Patient abstained from smoking., former smoker,    Pulmonary exam normal        Cardiovascular Exercise Tolerance: Good negative cardio ROS Normal cardiovascular exam     Neuro/Psych negative neurological ROS  negative psych ROS   GI/Hepatic negative GI ROS, Neg liver ROS,   Endo/Other  negative endocrine ROS  Renal/GU negative Renal ROS  negative genitourinary   Musculoskeletal   Abdominal   Peds  Hematology negative hematology ROS (+)   Anesthesia Other Findings History reviewed. No pertinent past medical history.   Reproductive/Obstetrics negative OB ROS                             Anesthesia Physical Anesthesia Plan  ASA: I  Anesthesia Plan: General   Post-op Pain Management:  Regional for Post-op pain   Induction: Intravenous  PONV Risk Score and Plan: 2 and Ondansetron, Dexamethasone, Midazolam and Treatment may vary due to age or medical condition  Airway Management Planned: LMA and Oral ETT  Additional Equipment:   Intra-op Plan:   Post-operative Plan: Extubation in OR  Informed Consent: I have reviewed the patients History and Physical, chart, labs and discussed the procedure including the risks, benefits and alternatives for the proposed anesthesia with the patient or authorized representative who has indicated his/her understanding and acceptance.     Dental Advisory Given  Plan  Discussed with: Anesthesiologist, CRNA and Surgeon  Anesthesia Plan Comments:         Anesthesia Quick Evaluation

## 2019-03-04 NOTE — ED Notes (Signed)
ED TO INPATIENT HANDOFF REPORT  ED Nurse Name and Phone #:  Lurena JoinerRebecca #[phone]  S Name/Age/Gender Dale Harmon 28 y.o. male Room/Bed: ED30A/ED30A  Code Status   Code Status: Not on file  Home/SNF/Other Home Patient oriented to: self, place, time and situation Is this baseline? Yes   Triage Complete: Triage complete  Chief Complaint Rt ankle injury  Triage Note Pt with obvious deformity to the right lower leg/ankle post fall down stairs. Pt admits to ETOH. Pt is A&O x4 on arrival. PA at the bedside.    Allergies Allergies  Allergen Reactions  . Amoxicillin   . Penicillins     Level of Care/Admitting Diagnosis ED Disposition    ED Disposition Condition Comment   Admit  [facility]ospital [facility] [[id number]]  Level of Care: Med-Surg [16]  Covid Evaluation: Asymptomatic Screening Protocol (No Symptoms)  Diagnosis: Fracture of tibial shaft, right, closed [[id number]][[id number]]  Admitting Physician: Lyndle HerrlichBOWERS, JAMES R [[id number]]  Attending Physician: Lyndle HerrlichBOWERS, JAMES R [[id number]]  Estimated length of stay: past midnight tomorrow  Certification:: I certify this patient will need inpatient services for at least 2 midnights  PT Class (Do Not Modify): Inpatient [101]  PT Acc Code (Do Not Modify): Private [1]       B Medical/Surgery History History reviewed. No pertinent past medical history. Past Surgical History:  Procedure Laterality Date  . NO PAST SURGERIES       A IV Location/Drains/Wounds Patient Lines/Drains/Airways Status   Active Line/Drains/Airways    Name:   Placement date:   Placement time:   Site:   Days:   Peripheral IV 03/03/19 Right Antecubital   03/03/19    2221    Antecubital   1          Intake/Output Last 24 hours No intake or output data in the 24 hours ending 03/04/19 0012  Labs/Imaging Results for orders placed or performed during the hospital encounter of 03/03/19 (from the past 48 hour(s))  Type and screen [facility]     Status: None   Collection Time: 03/03/19 10:05 PM  Result Value Ref Range   ABO/RH(D) A POS    Antibody Screen NEG    Sample Expiration      03/06/2019,2359 Performed at [facility], [address] [place], [place] [address]   SARS Coronavirus 2 by RT PCR (hospital order, performed in [facility] hospital lab) Nasopharyngeal Nasopharyngeal Swab     Status: None   Collection Time: 03/03/19 10:12 PM   Specimen: Nasopharyngeal Swab  Result Value Ref Range   SARS Coronavirus 2 NEGATIVE NEGATIVE    Comment: (NOTE) If result is NEGATIVE SARS-CoV-2 target nucleic acids are NOT DETECTED. The SARS-CoV-2 RNA is generally detectable in upper and lower  respiratory specimens during the acute phase of infection. The lowest  concentration of SARS-CoV-2 viral copies this assay can detect is 250  copies / mL. A negative result does not preclude SARS-CoV-2 infection  and should not be used as the sole basis for treatment or other  patient management decisions.  A negative result may occur with  improper specimen collection / handling, submission of specimen other  than nasopharyngeal swab, presence of viral mutation(s) within the  areas targeted by this assay, and inadequate number of viral copies  (<250 copies / mL). A negative result must be combined with clinical  observations, patient history, and epidemiological information. If result is POSITIVE SARS-CoV-2 target nucleic acids are DETECTED. The SARS-CoV-2  RNA is generally detectable in upper and lower  respiratory specimens dur ing the acute phase of infection.  Positive  results are indicative of active infection with SARS-CoV-2.  Clinical  correlation with patient history and other diagnostic information is  necessary to determine patient infection status.  Positive results do  not rule out bacterial infection or co-infection with other viruses. If result is PRESUMPTIVE POSTIVE SARS-CoV-2 nucleic acids MAY BE  PRESENT.   A presumptive positive result was obtained on the submitted specimen  and confirmed on repeat testing.  While 2019 novel coronavirus  (SARS-CoV-2) nucleic acids may be present in the submitted sample  additional confirmatory testing may be necessary for epidemiological  and / or clinical management purposes  to differentiate between  SARS-CoV-2 and other Sarbecovirus currently known to infect humans.  If clinically indicated additional testing with an alternate test  methodology [phone]) is advised. The SARS-CoV-2 RNA is generally  detectable in upper and lower respiratory sp ecimens during the acute  phase of infection. The expected result is Negative. Fact Sheet for Patients:  BoilerBrush.com.cy Fact Sheet for Healthcare Providers: https://pope.com/ This test is not yet approved or cleared by the [place] FDA and has been authorized for detection and/or diagnosis of SARS-CoV-2 by FDA under an Emergency Use Authorization (EUA).  This EUA will remain in effect (meaning this test can be used) for the duration of the COVID-19 declaration under Section 564(b)(1) of the Act, 21 U.S.C. section 360bbb-3(b)(1), unless the authorization is terminated or revoked sooner. Performed at [facility], [address] [place], [place] [address]   Comprehensive metabolic panel     Status: Abnormal   Collection Time: 03/03/19 10:13 PM  Result Value Ref Range   Sodium 143 135 - 145 mmol/L   Potassium 3.7 3.5 - 5.1 mmol/L   Chloride 106 98 - 111 mmol/L   CO2 26 22 - 32 mmol/L   Glucose, Bld 124 (H) 70 - 99 mg/dL   BUN 11 6 - 20 mg/dL   Creatinine, Ser 8.11 0.61 - 1.24 mg/dL   Calcium 9.2 8.9 - 91.4 mg/dL   Total Protein 7.4 6.5 - 8.1 g/dL   Albumin 4.5 3.5 - 5.0 g/dL   AST 20 15 - 41 U/L   ALT 15 0 - 44 U/L   Alkaline Phosphatase 54 38 - 126 U/L   Total Bilirubin 0.4 0.3 - 1.2 mg/dL   GFR calc non Af Amer >60 >60 mL/min    GFR calc Af Amer >60 >60 mL/min   Anion gap 11 5 - 15    Comment: Performed at [facility], [address] [place], [place] [address]  CBC with Differential     Status: None   Collection Time: 03/03/19 10:13 PM  Result Value Ref Range   WBC 9.9 4.0 - 10.5 K/uL   RBC 4.29 4.22 - 5.81 MIL/uL   Hemoglobin 13.6 13.0 - 17.0 g/dL   HCT 62.1 30.8 - 65.7 %   MCV 94.9 80.0 - 100.0 fL   MCH 31.7 26.0 - 34.0 pg   MCHC 33.4 30.0 - 36.0 g/dL   RDW 84.6 96.2 - 95.2 %   Platelets 364 150 - 400 K/uL   nRBC 0.0 0.0 - 0.2 %   Neutrophils Relative % 49 %   Neutro Abs 4.9 1.7 - 7.7 K/uL   Lymphocytes Relative 39 %   Lymphs Abs 3.9 0.7 - 4.0 K/uL   Monocytes Relative 9 %   Monocytes  Absolute 0.8 0.1 - 1.0 K/uL   Eosinophils Relative 2 %   Eosinophils Absolute 0.2 0.0 - 0.5 K/uL   Basophils Relative 1 %   Basophils Absolute 0.1 0.0 - 0.1 K/uL   Immature Granulocytes 0 %   Abs Immature Granulocytes 0.03 0.00 - 0.07 K/uL    Comment: Performed at [facility], [address] [place], [place] [address]  Protime-INR     Status: None   Collection Time: 03/03/19 10:13 PM  Result Value Ref Range   Prothrombin Time 14.1 11.4 - 15.2 seconds   INR 1.1 0.8 - 1.2    Comment: (NOTE) INR goal varies based on device and disease states. Performed at [facility], [address] [place], [place] [address]    Dg Chest 1 View  Result Date: 03/03/2019 CLINICAL DATA:  Ankle injury EXAM: CHEST  1 VIEW COMPARISON:  None. FINDINGS: Rotated patient. No focal airspace disease or pleural effusion. Heart size within normal limits. No pneumothorax. IMPRESSION: No active disease.  Rotated patient Electronically Signed   By: Jasmine Pang M.D.   On: 03/03/2019 22:34   Dg Tibia/fibula Right  Result Date: 03/03/2019 CLINICAL DATA:  Injury EXAM: RIGHT TIBIA AND FIBULA - 2 VIEW COMPARISON:  None. FINDINGS: Acute comminuted fracture involving the proximal shaft of the fibula at the junction  of the proximal and middle thirds. 1/2 shaft diameter medial and posterior displacement of the main fracture fragment. Incompletely visualized displaced spiral fracture of the distal tibia. IMPRESSION: 1. Acute comminuted and displaced fracture involving proximal shaft of the fibula 2. Incompletely visualized displaced fracture of the distal tibia Electronically Signed   By: Jasmine Pang M.D.   On: 03/03/2019 22:35   Dg Ankle Complete Right  Result Date: 03/03/2019 CLINICAL DATA:  Injury EXAM: RIGHT ANKLE - COMPLETE 3+ VIEW COMPARISON:  None. FINDINGS: Incompletely visualized fracture of the proximal to mid shaft of fibula. Spiral fracture distal shaft of the tibia with slightly greater than 1/2 shaft diameter lateral, and about 1/4 shaft diameter of anterior displacement of distal fracture fragment. IMPRESSION: Acute displaced spiral fracture of the distal tibia. Incompletely visualized fracture involving the proximal to midshaft of the fibula Electronically Signed   By: Jasmine Pang M.D.   On: 03/03/2019 22:34    Pending Labs [employer] (From admission, onward)    Start     Ordered   Signed and Held  HIV Antibody (routine testing w rflx)  (HIV Antibody (Routine testing w reflex) panel)  Once,   R     Signed and Held          Vitals/Pain Today's Vitals   03/03/19 2159  BP: 125/62  Pulse: 92  Resp: 18  Temp: 98.6 F (37 C)  TempSrc: Oral  SpO2: 99%    Isolation Precautions No active isolations  Medications Medications  oxyCODONE-acetaminophen (PERCOCET/ROXICET) 5-325 MG per tablet 1 tablet (has no administration in time range)  Tdap (BOOSTRIX) injection 0.5 mL (0.5 mLs Intramuscular Given 03/03/19 2221)  morphine 4 MG/ML injection 4 mg (4 mg Intravenous Given 03/03/19 2223)  ondansetron (ZOFRAN) injection 4 mg (4 mg Intravenous Given 03/03/19 2223)  sodium chloride 0.9 % bolus 1,000 mL (1,000 mLs Intravenous New Bag/Given 03/03/19 2223)  morphine 2 MG/ML injection 2 mg  (2 mg Intravenous Given 03/03/19 2350)    Mobility walks with person assist due to injury  High fall risk   Focused Assessments Cardiac Assessment Handoff:    No results found for:  CKTOTAL, CKMB, CKMBINDEX, TROPONINI No results found for: DDIMER Does the Patient currently have chest pain? Yes      R Recommendations: See Admitting Provider Note  Report given to:   Additional Notes:    NPO after midnight

## 2019-03-05 MED ORDER — ASPIRIN EC 81 MG PO TBEC: 81.0000 mg | DELAYED_RELEASE_TABLET | Freq: Every day | ORAL | 0 refills | Status: AC

## 2019-03-05 MED ORDER — DOCUSATE SODIUM 100 MG PO CAPS: 100.0000 mg | ORAL_CAPSULE | Freq: Two times a day (BID) | ORAL | 0 refills | Status: DC

## 2019-03-05 MED ORDER — OXYCODONE HCL 5 MG PO TABS: 5.0000 mg | ORAL_TABLET | Freq: Four times a day (QID) | ORAL | 0 refills | Status: DC | PRN

## 2019-03-05 NOTE — Progress Notes (Signed)
Patient ready to discharge from unit. Family here to transport home. All personal belongings with patient. Disharge information, meds and follow up appts reviewed with patient.   No distress at this time.

## 2019-03-05 NOTE — Evaluation (Signed)
Physical Therapy Evaluation Patient Details Name: Dale Harmon MRN: [id number] DOB: August 05, 1990 Today's Date: 03/05/2019   History of Present Illness  28 yo male with onset of fall on cement steps was admitted for ORIF of spiral tibial fracture on RLE, fibular midshaft fracture.  Pt was told per report he should  minimize WB on R foot despite WBAT orders.  PMHx:  migraine, syncope, cephalalgia  Clinical Impression  Pt was seen for mobility and strength training, instructed stairs and ROM to RLE with assist of LLE to provide neuromotor input.  Pt is demonstrating understanding of all instructions but forgot pain meds had been given.  Will have family to monitor his safety and progress and will have his surgeon follow up with therapy recommendations when appropriate.    Follow Up Recommendations Supervision for mobility/OOB;Supervision - Intermittent    Equipment Recommendations  Crutches    Recommendations for Other Services       Precautions / Restrictions Precautions Precautions: Fall Precaution Comments: minimize wb on R foot Required Braces or Orthoses: Other Brace Other Brace: cast shoe R foot for protection of bandage Restrictions Weight Bearing Restrictions: Yes Other Position/Activity Restrictions: minimal WB on R foot      Mobility  Bed Mobility Overal bed mobility: Modified Independent             General bed mobility comments: extra time to get off bed, able to lift with UE"s to take RLE off side of bed  Transfers Overall transfer level: Needs assistance Equipment used: Crutches;1 person hand held assist Transfers: Sit to/from Stand Sit to Stand: Min guard         General transfer comment: min guard for safety, following instructions given by PT to handle crutches safetly  Ambulation/Gait Ambulation/Gait assistance: Min guard Gait Distance (Feet): 125 Feet Assistive device: Crutches   Gait velocity: reduced Gait velocity interpretation: <1.31  ft/sec, indicative of household ambulator General Gait Details: hopping on LLE with touch at most on RLE, in cast shoe.  Pt is able to clear R foot on the floor  Stairs Stairs: Yes Stairs assistance: Min guard Stair Management: No rails;One rail Right;With crutches;Forwards Number of Stairs: 4 General stair comments: instructed both one crutch and two crutches for safety  Wheelchair Mobility    Modified Rankin (Stroke Patients Only)       Balance Overall balance assessment: Needs assistance;History of Falls Sitting-balance support: Feet supported Sitting balance-Leahy Scale: Good     Standing balance support: Bilateral upper extremity supported;During functional activity Standing balance-Leahy Scale: Fair Standing balance comment: fair with RLE NWB                             Pertinent Vitals/Pain Pain Assessment: Faces Faces Pain Scale: Hurts little more Pain Location: knee and inner lower leg near knee Pain Descriptors / Indicators: Discomfort Pain Intervention(s): Limited activity within patient's tolerance;Monitored during session;Premedicated before session;Repositioned    Home Living Family/patient expects to be discharged to:: Private residence Living Arrangements: Parent Available Help at Discharge: Family;Available 24 hours/day Type of Home: House Home Access: Stairs to enter Entrance Stairs-Rails: Right;Left;Can reach both Entrance Stairs-Number of Steps: 4 Home Layout: One level Home Equipment: None Additional Comments: needs basic axillary crutches    Prior Function Level of Independence: Independent         Comments: working and driving      Hand Dominance   Dominant Hand: Right    Extremity/Trunk Assessment  Upper Extremity Assessment Upper Extremity Assessment: Overall WFL for tasks assessed    Lower Extremity Assessment Lower Extremity Assessment: RLE deficits/detail RLE Deficits / Details: new post op tib/fib fracture  with ORIF RLE: Unable to fully assess due to immobilization RLE Sensation: decreased light touch RLE Coordination: decreased fine motor;decreased gross motor    Cervical / Trunk Assessment Cervical / Trunk Assessment: Normal  Communication   Communication: No difficulties  Cognition Arousal/Alertness: Awake/alert Behavior During Therapy: WFL for tasks assessed/performed Overall Cognitive Status: Within Functional Limits for tasks assessed                                 General Comments: pt is taking pain meds, forgot he had meds      General Comments General comments (skin integrity, edema, etc.): pt walked on hall and with stairs, instructed ROM to RLE    Exercises General Exercises - Lower Extremity Ankle Circles/Pumps: AAROM;AROM;Both;5 reps Quad Sets: AROM;AAROM;Both;10 reps Gluteal Sets: AROM;Both;10 reps Heel Slides: AAROM;Both;5 reps Straight Leg Raises: AAROM;Right;10 reps   Assessment/Plan    PT Assessment Patient needs continued PT services  PT Problem List Decreased strength;Decreased range of motion;Decreased activity tolerance;Decreased balance;Decreased mobility;Decreased coordination;Decreased knowledge of use of DME;Decreased safety awareness;Decreased skin integrity;Pain       PT Treatment Interventions DME instruction;Stair training;Gait training;Functional mobility training;Therapeutic activities;Therapeutic exercise;Balance training;Neuromuscular re-education;Patient/family education    PT Goals (Current goals can be found in the Care Plan section)  Acute Rehab PT Goals Patient Stated Goal: to go home and feel better PT Goal Formulation: With patient Time For Goal Achievement: 03/19/19 Potential to Achieve Goals: Good    Frequency 7X/week   Barriers to discharge Inaccessible home environment home with family but with stairs to navigate    Co-evaluation               AM-PAC PT "6 Clicks" Mobility  Outcome Measure Help  needed turning from your back to your side while in a flat bed without using bedrails?: None Help needed moving from lying on your back to sitting on the side of a flat bed without using bedrails?: None Help needed moving to and from a bed to a chair (including a wheelchair)?: A Little Help needed standing up from a chair using your arms (e.g., wheelchair or bedside chair)?: A Little Help needed to walk in hospital room?: A Little Help needed climbing 3-5 steps with a railing? : A Little 6 Click Score: 20    End of Session Equipment Utilized During Treatment: Gait belt Activity Tolerance: Patient tolerated treatment well;Patient limited by fatigue Patient left: in bed;with call bell/phone within reach;with bed alarm set Nurse Communication: Mobility status PT Visit Diagnosis: Unsteadiness on feet (R26.81);Muscle weakness (generalized) (M62.81);History of falling (Z91.81)    Time: 2122-4825 PT Time Calculation (min) (ACUTE ONLY): 39 min   Charges:   PT Evaluation $PT Eval Moderate Complexity: 1 Mod PT Treatments $Gait Training: 8-22 mins $Therapeutic Exercise: 8-22 mins       Ivar Drape 03/05/2019, 11:38 AM   Samul Dada, PT MS Acute Rehab Dept. Number: [facility] [id number] and [facility] [phone]

## 2019-03-05 NOTE — TOC Transition Note (Signed)
Transition of Care [facility]) - CM/SW Discharge Note   Patient Details  Name: Dale Harmon MRN: [id number] Date of Birth: [birth year]  Transition of Care [facility]) CM/SW Contact:  Weston Anna, LCSW Phone Number: 03/05/2019, 11:46 AM   Clinical Narrative:     Patient set to discharge home today- patient provided crutches at bedside through [employer]. No other needs at this time.   Final next level of care: Home/Self Care Barriers to Discharge: Continued Medical Work up   Patient Goals and CMS Choice Patient states their goals for this hospitalization and ongoing recovery are:: To go home.      Discharge Placement                       Discharge Plan and Services In-house Referral: Clinical Social Work              DME Arranged: Crutches DME Agency: [employer] Date DME Agency Contacted: 03/05/19 Time DME Agency Contacted: [id number] Representative spoke with at DME Agency: Leroy [facility] HH Arranged: NA          Social Determinants of Health ([place]) Interventions     Readmission Risk Interventions No flowsheet data found.

## 2019-03-05 NOTE — Discharge Instructions (Signed)
May weight bear as tolerated, crutches as needed Remove dressing and place band-aids on Monday Follow up with Carlynn Spry PA-C in 10 to 14 days [facility] extremity May use ice 20 min on 20 min off as needed Call with questions or fever, drainage, shortness of breath to [phone]

## 2019-03-05 NOTE — Discharge Summary (Signed)
Physician Discharge Summary  Patient ID: Dale Harmon MRN: [id number] DOB/AGE: 1990/08/08 28 y.o.  Admit date: 03/03/2019 Discharge date: 03/05/2019  Admission Diagnoses:  tibial frature  <principal problem not specified>  Discharge Diagnoses:  tibial frature  Active Problems:   Fracture of tibial shaft, right, closed   History reviewed. No pertinent past medical history.  Surgeries: Procedure(s): INTRAMEDULLARY (IM) NAIL TIBIAL, RIGHT on 03/04/2019   Consultants (if any):   Discharged Condition: Improved  Hospital Course: Dale Harmon is an 28 y.o. male who was admitted 03/03/2019 with a diagnosis of  tibial frature  <principal problem not specified> and went to the operating room on 03/04/2019 and underwent the above named procedures.    He was given perioperative antibiotics:  Anti-infectives (From admission, onward)   Start     Dose/Rate Route Frequency Ordered Stop   03/04/19 1553  50,000 units bacitracin in 0.9% normal saline 250 mL irrigation  Status:  Discontinued       As needed 03/04/19 1553 03/04/19 1935   03/04/19 0610  clindamycin (CLEOCIN) IVPB 900 mg     900 mg 100 mL/hr over 30 Minutes Intravenous 30 min pre-op 03/04/19 0610 03/04/19 1754    .  He was given sequential compression devices, early ambulation, and aspirin for DVT prophylaxis.  He benefited maximally from the hospital stay and there were no complications.    Recent vital signs:  Vitals:   03/05/19 0400 03/05/19 0736  BP: (!) 100/52 115/66  Pulse: 77 78  Resp: 18 18  Temp:  98 F (36.7 C)  SpO2: 99% 100%    Recent laboratory studies:  Lab Results  Component Value Date   HGB 13.6 03/03/2019   Lab Results  Component Value Date   WBC 9.9 03/03/2019   PLT 364 03/03/2019   Lab Results  Component Value Date   INR 1.1 03/03/2019   Lab Results  Component Value Date   NA 143 03/03/2019   K 3.7 03/03/2019   CL 106 03/03/2019   CO2 26 03/03/2019   BUN 11  03/03/2019   CREATININE 1.01 03/03/2019   GLUCOSE 124 (H) 03/03/2019    Discharge Medications:   Allergies as of 03/05/2019      Reactions   Amoxicillin Hives   Penicillins       Medication List    TAKE these medications   aspirin EC 81 MG tablet Take 1 tablet (81 mg total) by mouth daily.   docusate sodium 100 MG capsule Commonly known as: Colace Take 1 capsule (100 mg total) by mouth 2 (two) times daily.   oxyCODONE 5 MG immediate release tablet Commonly known as: Roxicodone Take 1 tablet (5 mg total) by mouth every 6 (six) hours as needed.       Diagnostic Studies: Dg Chest 1 View  Result Date: 03/03/2019 CLINICAL DATA:  Ankle injury EXAM: CHEST  1 VIEW COMPARISON:  None. FINDINGS: Rotated patient. No focal airspace disease or pleural effusion. Heart size within normal limits. No pneumothorax. IMPRESSION: No active disease.  Rotated patient Electronically Signed   By: Jasmine Pang M.D.   On: 03/03/2019 22:34   Dg Wrist Complete Right  Result Date: 03/04/2019 CLINICAL DATA:  28 year old male with right wrist pain. EXAM: RIGHT WRIST - COMPLETE 3+ VIEW COMPARISON:  None. FINDINGS: There is no evidence of fracture or dislocation. There is no evidence of arthropathy or other focal bone abnormality. Soft tissues are unremarkable. IMPRESSION: Negative. Electronically Signed   By: Burtis Junes  Radparvar M.D.   On: 03/04/2019 20:39   Dg Tibia/fibula Right  Result Date: 03/04/2019 CLINICAL DATA:  ORIF of tibia. EXAM: RIGHT TIBIA AND FIBULA - 2 VIEW; DG C-ARM 1-60 MIN COMPARISON:  None. FINDINGS: Thirty-five intraop images were obtained of ORIF intramedullary rod fixation of the distal tibial fracture. Fluoro time 14 seconds IMPRESSION: Intraop views of ORIF distal tibia. Electronically Signed   By: Prudencio Pair M.D.   On: 03/04/2019 21:17   Dg Tibia/fibula Right  Result Date: 03/03/2019 CLINICAL DATA:  Injury EXAM: RIGHT TIBIA AND FIBULA - 2 VIEW COMPARISON:  None. FINDINGS: Acute  comminuted fracture involving the proximal shaft of the fibula at the junction of the proximal and middle thirds. 1/2 shaft diameter medial and posterior displacement of the main fracture fragment. Incompletely visualized displaced spiral fracture of the distal tibia. IMPRESSION: 1. Acute comminuted and displaced fracture involving proximal shaft of the fibula 2. Incompletely visualized displaced fracture of the distal tibia Electronically Signed   By: Donavan Foil M.D.   On: 03/03/2019 22:35   Dg Ankle Complete Right  Result Date: 03/03/2019 CLINICAL DATA:  Injury EXAM: RIGHT ANKLE - COMPLETE 3+ VIEW COMPARISON:  None. FINDINGS: Incompletely visualized fracture of the proximal to mid shaft of fibula. Spiral fracture distal shaft of the tibia with slightly greater than 1/2 shaft diameter lateral, and about 1/4 shaft diameter of anterior displacement of distal fracture fragment. IMPRESSION: Acute displaced spiral fracture of the distal tibia. Incompletely visualized fracture involving the proximal to midshaft of the fibula Electronically Signed   By: Donavan Foil M.D.   On: 03/03/2019 22:34   Dg C-arm 1-60 Min  Result Date: 03/04/2019 CLINICAL DATA:  ORIF of tibia. EXAM: RIGHT TIBIA AND FIBULA - 2 VIEW; DG C-ARM 1-60 MIN COMPARISON:  None. FINDINGS: Thirty-five intraop images were obtained of ORIF intramedullary rod fixation of the distal tibial fracture. Fluoro time 14 seconds IMPRESSION: Intraop views of ORIF distal tibia. Electronically Signed   By: Prudencio Pair M.D.   On: 03/04/2019 21:17   [place] Or Harmon Block-image Only ([facility])  Result Date: 03/04/2019 There is no interpretation for this exam.  This order is for images obtained during a surgical procedure.  Please See "Surgeries" Tab for more information regarding the procedure.    Disposition: Discharge disposition: 01-Home or Self Care         Follow-up Information    Lovell Sheehan, MD. Go on 03/07/2019.   Specialty: Orthopedic  Surgery Why: Call tomorrow for appointment time on Monday. Contact information: [place] [address] [phone]            Signed: Lovell Sheehan ,MD 03/05/2019, 11:23 AM

## 2019-03-06 ENCOUNTER — Other Ambulatory Visit: Payer: Self-pay | Admitting: Orthopedic Surgery

## 2019-03-07 ENCOUNTER — Encounter: Payer: Self-pay | Admitting: Orthopedic Surgery

## 2019-12-11 DIAGNOSIS — U071 COVID-19: Secondary | ICD-10-CM

## 2019-12-11 HISTORY — DX: COVID-19: U07.1

## 2020-02-29 ENCOUNTER — Ambulatory Visit
Admission: RE | Admit: 2020-02-29 | Discharge: 2020-02-29 | Disposition: A | Discharge status: Home / Self Care | Payer: Self-pay | Source: Ambulatory Visit | Attending: Family Medicine | Admitting: Family Medicine

## 2020-02-29 ENCOUNTER — Other Ambulatory Visit: Payer: Self-pay

## 2020-02-29 VITALS — BP 117/77 | HR 80 | Temp 98.4°F | Resp 18 | Ht 68.0 in | Wt 145.0 lb

## 2020-02-29 DIAGNOSIS — J029 Acute pharyngitis, unspecified: Secondary | ICD-10-CM

## 2020-02-29 LAB — GROUP A STREP BY PCR: Group A Strep by PCR: NOT DETECTED

## 2020-02-29 NOTE — Discharge Instructions (Addendum)
Rest. Fluids. ° °Ibuprofen as needed. ° °Take care ° °Dr. Lynae Pederson  °

## 2020-02-29 NOTE — ED Triage Notes (Signed)
Patient c/o sore throat, headache that started yesterday.

## 2020-02-29 NOTE — ED Provider Notes (Signed)
[facility]    CSN: [id number] Arrival date & time: 02/29/20  1140      History   Chief Complaint Chief Complaint  Patient presents with  . Sore Throat   HPI  29 year old male presents with the above complaint.  Patient reports that his symptoms started yesterday.  He has had sore throat, headache, and some congestion.  He is concerned he has strep throat.  No fever.  No relieving factors.  No reported sick contacts.  No other associated symptoms.  No other complaints.  Past Medical History:  Diagnosis Date  . COVID 12/11/2019    Patient Active Problem List   Diagnosis Date Noted  . Fracture of tibial shaft, right, closed 03/03/2019  . Psoriasis 12/28/2014  . Acute onset aura migraine 12/26/2014  . Episode of syncope 12/26/2014  . Cephalalgia 10/31/2014    Past Surgical History:  Procedure Laterality Date  . NO PAST SURGERIES    . TIBIA IM NAIL INSERTION Right 03/04/2019   Procedure: INTRAMEDULLARY (IM) NAIL TIBIAL, RIGHT;  Surgeon: Lyndle Herrlich, MD;  Location: [facility] ORS;  Service: Orthopedics;  Laterality: Right;       Home Medications    Prior to Admission medications   Not on File    Family History Family History  Problem Relation Age of Onset  . Healthy Mother   . Healthy Father     Social History Social History   Tobacco Use  . Smoking status: Former Smoker    Quit date: 11/10/2014    Years since quitting: 5.3  . Smokeless tobacco: Never Used  Substance Use Topics  . Alcohol use: Yes    Alcohol/week: 0.0 standard drinks    Comment: MODERATE USE  . Drug use: No     Allergies   Amoxicillin and Penicillins   Review of Systems Review of Systems Per HPI  Physical Exam Triage Vital Signs ED Triage Vitals  Enc Vitals Group     BP 02/29/20 1210 117/77     Pulse Rate 02/29/20 1210 80     Resp 02/29/20 1210 18     Temp 02/29/20 1210 98.4 F (36.9 C)     Temp Source 02/29/20 1210 Oral     SpO2 02/29/20 1210 98 %      Weight 02/29/20 1205 145 lb (65.8 kg)     Height 02/29/20 1205 5\' 8"  (1.727 m)     Head Circumference --      Peak Flow --      Pain Score 02/29/20 1205 5     Pain Loc --      Pain Edu? --      Excl. in [place]? --    Updated Vital Signs BP 117/77 (BP Location: Right Arm)   Pulse 80   Temp 98.4 F (36.9 C) (Oral)   Resp 18   Ht 5\' 8"  (1.727 m)   Wt 65.8 kg   SpO2 98%   BMI 22.05 kg/m   Visual Acuity Right Eye Distance:   Left Eye Distance:   Bilateral Distance:    Right Eye Near:   Left Eye Near:    Bilateral Near:     Physical Exam Vitals and nursing note reviewed.  Constitutional:      General: He is not in acute distress.    Appearance: Normal appearance. He is not ill-appearing.  HENT:     Head: Normocephalic and atraumatic.     Right Ear: Tympanic membrane normal.     Left  Ear: Tympanic membrane normal.     Mouth/Throat:     Pharynx: Posterior oropharyngeal erythema present. No oropharyngeal exudate.  Eyes:     General:        Right eye: No discharge.        Left eye: No discharge.     Conjunctiva/sclera: Conjunctivae normal.  Cardiovascular:     Rate and Rhythm: Normal rate and regular rhythm.     Heart sounds: No murmur heard.   Pulmonary:     Effort: Pulmonary effort is normal.     Breath sounds: Normal breath sounds. No wheezing, rhonchi or rales.  Neurological:     Mental Status: He is alert.  Psychiatric:        Mood and Affect: Mood normal.        Behavior: Behavior normal.    UC Treatments / Results  Labs (all labs ordered are listed, but only abnormal results are displayed) Labs Reviewed  GROUP A STREP BY PCR    EKG   Radiology No results found.  Procedures Procedures (including critical care time)  Medications Ordered in UC Medications - No data to display  Initial Impression / Assessment and Plan / UC Course  I have reviewed the triage vital signs and the nursing notes.  Pertinent labs & imaging results that were available  during my care of the patient were reviewed by me and considered in my medical decision making (see chart for details).    29 year old male presents with viral pharyngitis.  Strep PCR negative.  Rest, fluids.  Tylenol and ibuprofen as needed.  Declined Covid test after talking with employer.   Final Clinical Impressions(s) / UC Diagnoses   Final diagnoses:  Viral pharyngitis     Discharge Instructions     Rest. Fluids.  Ibuprofen as needed.  Take care  Dr. Adriana Simas     ED Prescriptions    None     PDMP not reviewed this encounter.   Tommie Sams, [place] 02/29/20 1314

## 2020-03-18 IMAGING — XA DG TIBIA/FIBULA 2V*R*
15 of 24 series · 15 of 24 positions shown · non-contrast
Comparison: None.

CLINICAL DATA: ORIF of tibia.

EXAM:
RIGHT TIBIA AND FIBULA - 2 VIEW; DG C-ARM 1-60 MIN

[Series 1: cont. · 1 of 1 slices shown (1 of 15)]
[im 1/1]
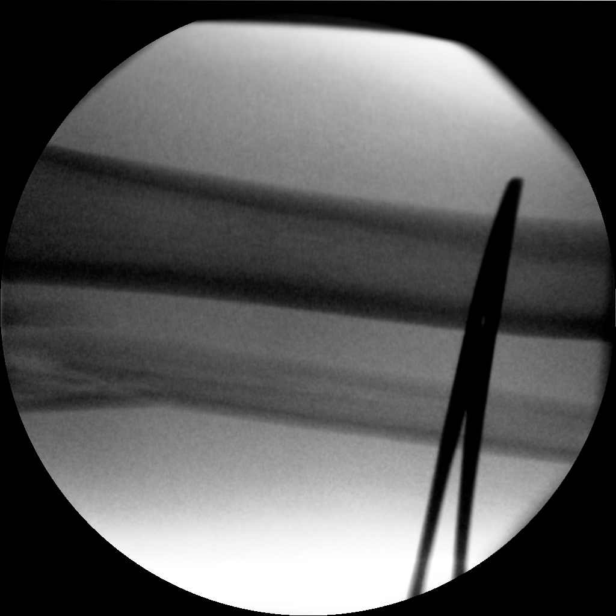

[Series 3: cont. · 1 of 1 slices shown (2 of 15)]
[im 1/1]
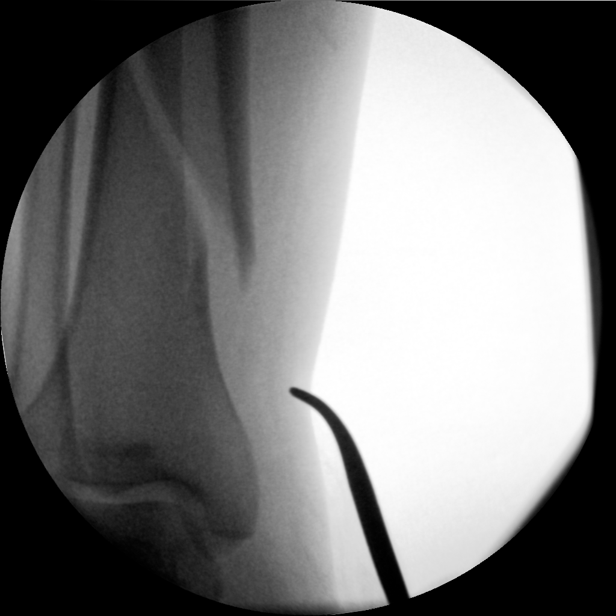

[Series 5: cont. · 1 of 1 slices shown (3 of 15)]
[im 1/1]
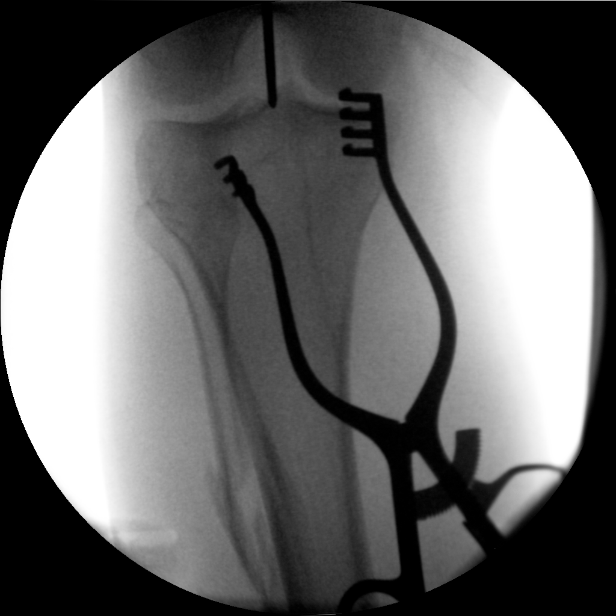

[Series 6: cont. · 1 of 1 slices shown (4 of 15)]
[im 1/1]
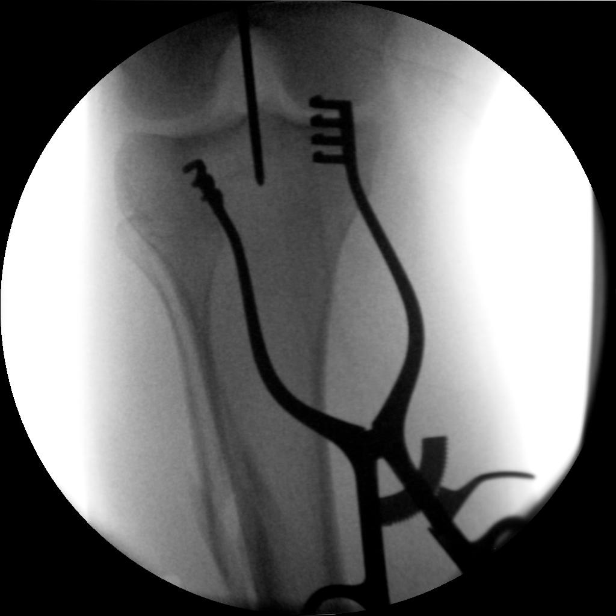

[Series 8: cont. · 1 of 1 slices shown (5 of 15)]
[im 1/1]
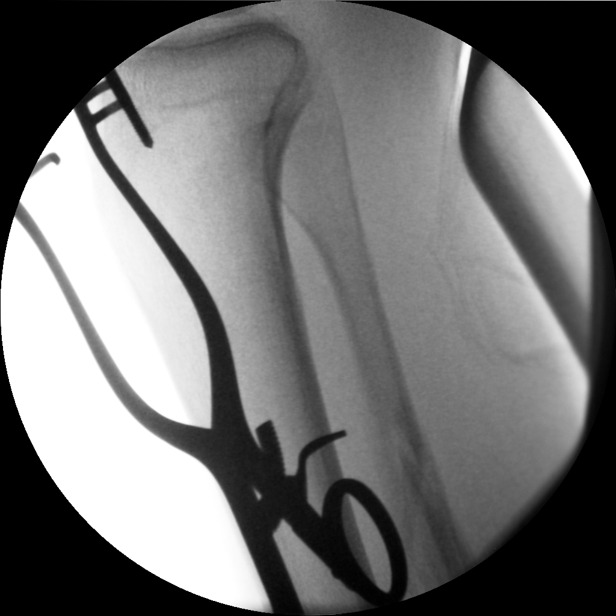

[Series 9: cont. · 1 of 1 slices shown (6 of 15)]
[im 1/1]
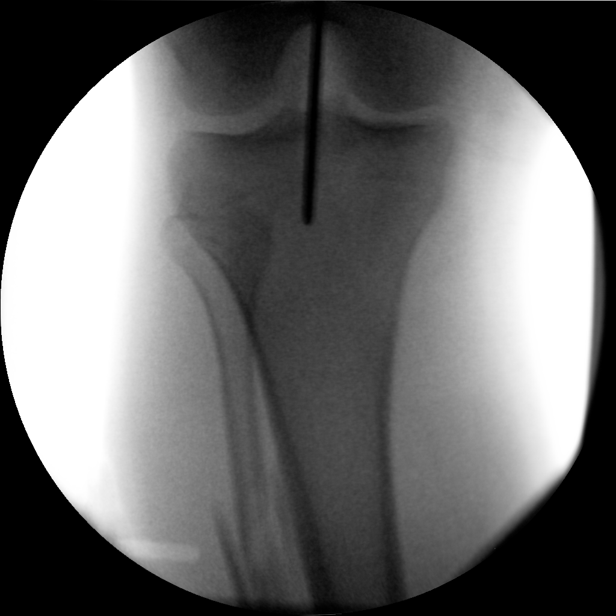

[Series 11: cont. · 1 of 1 slices shown (7 of 15)]
[im 1/1]
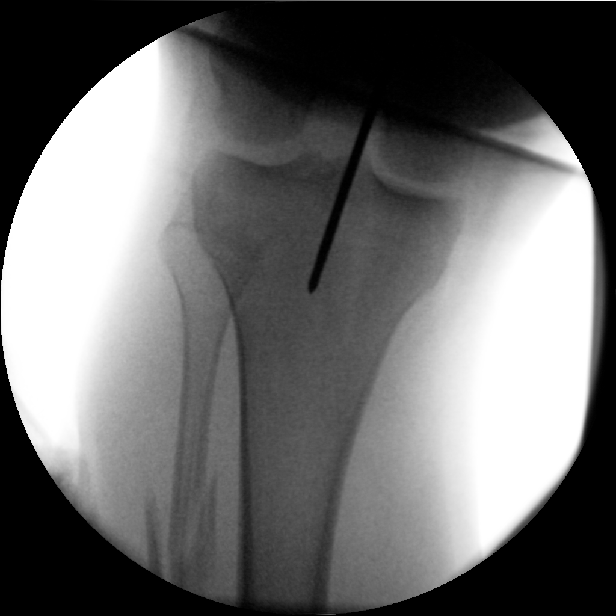

[Series 13: cont. · 1 of 1 slices shown (8 of 15)]
[im 1/1]
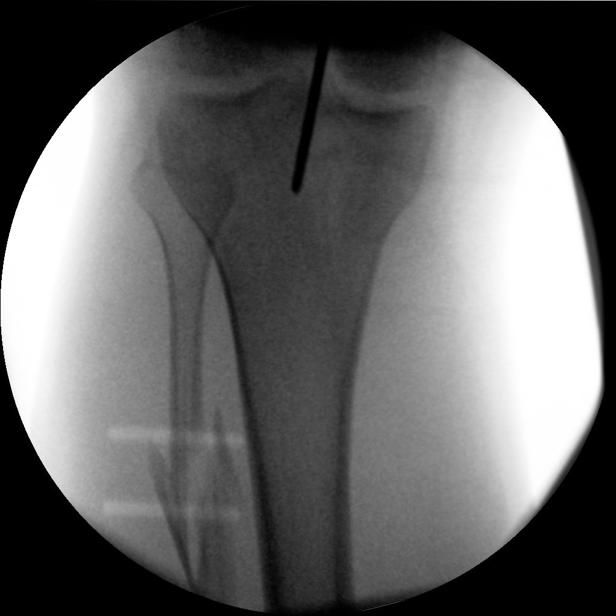

[Series 14: cont. · 1 of 1 slices shown (9 of 15)]
[im 1/1]
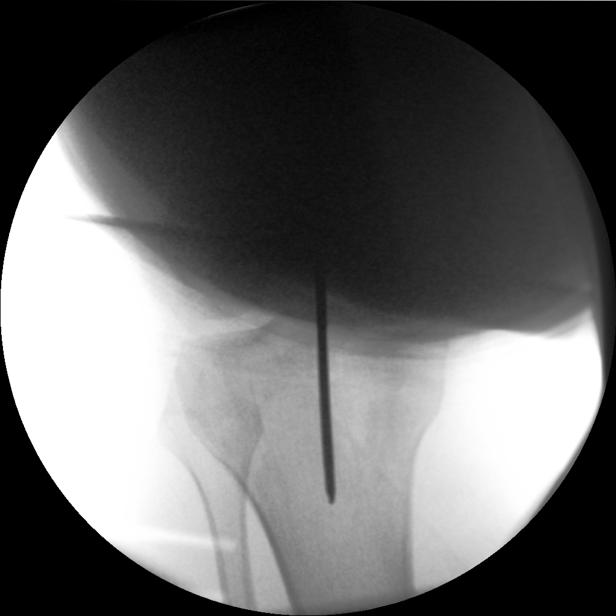

[Series 16: cont. · 1 of 1 slices shown (10 of 15)]
[im 1/1]
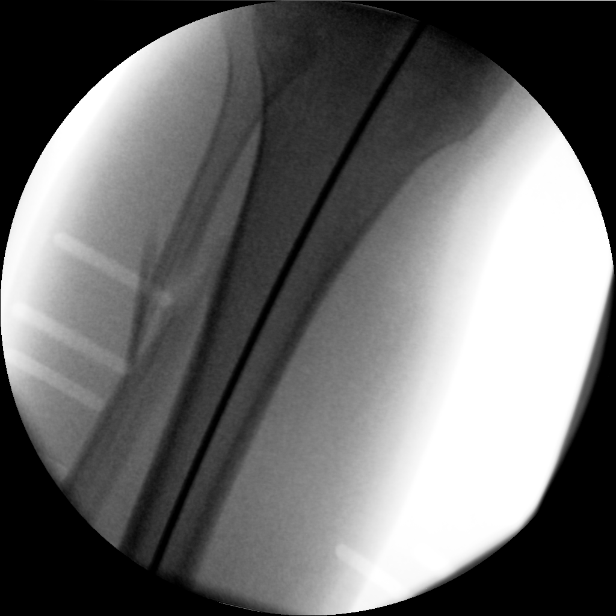

[Series 17: cont. · 1 of 1 slices shown (11 of 15)]
[im 1/1]
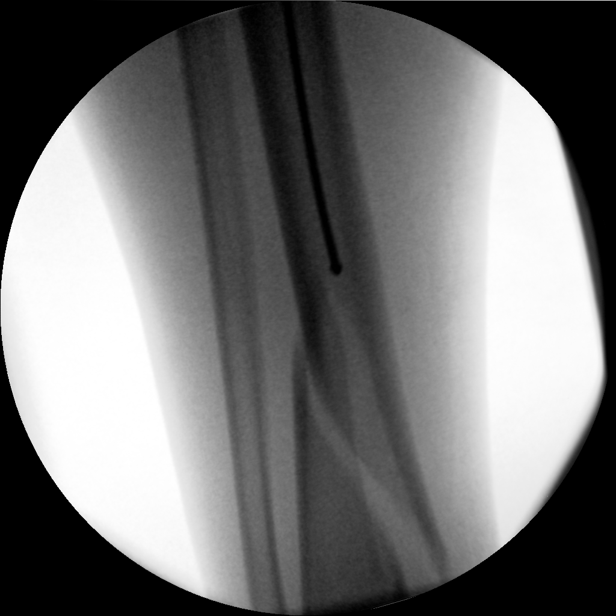

[Series 19: cont. · 1 of 1 slices shown (12 of 15)]
[im 1/1]
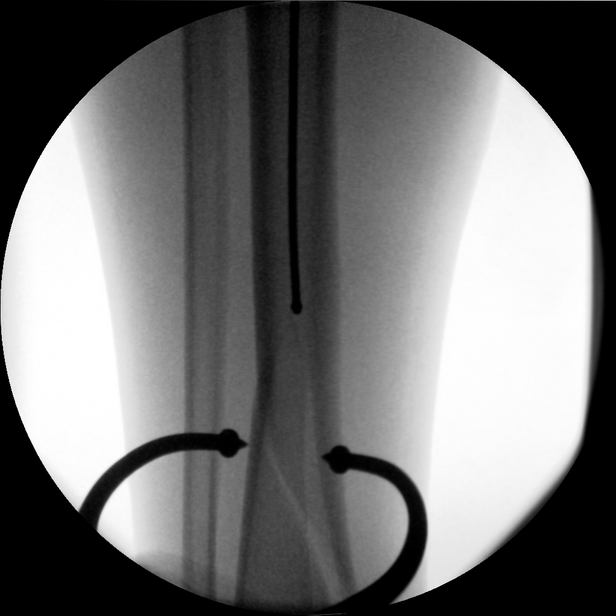

[Series 21: cont. · 1 of 1 slices shown (13 of 15)]
[im 1/1]
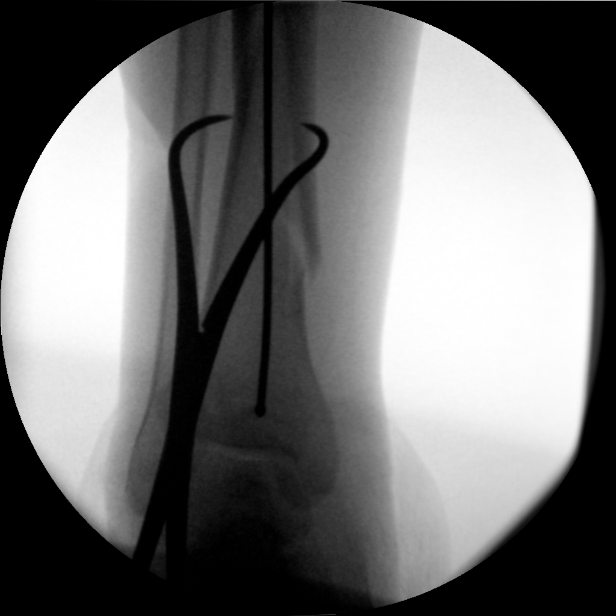

[Series 22: cont. · 1 of 1 slices shown (14 of 15)]
[im 1/1]
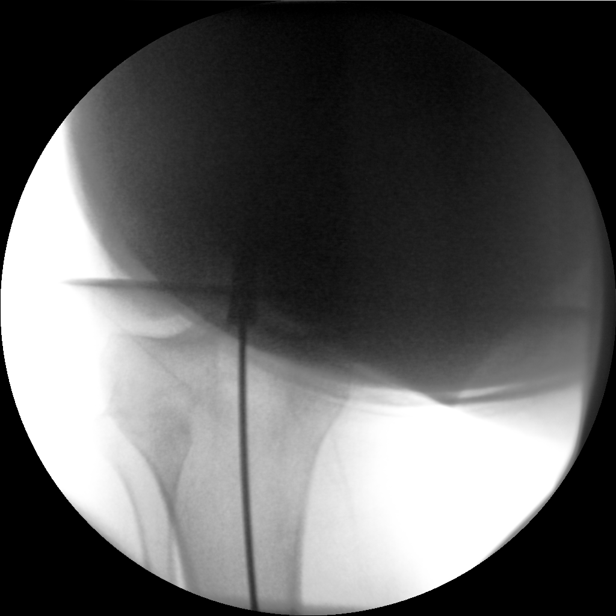

[Series 24: cont. · 1 of 1 slices shown (15 of 15)]
[im 1/1]
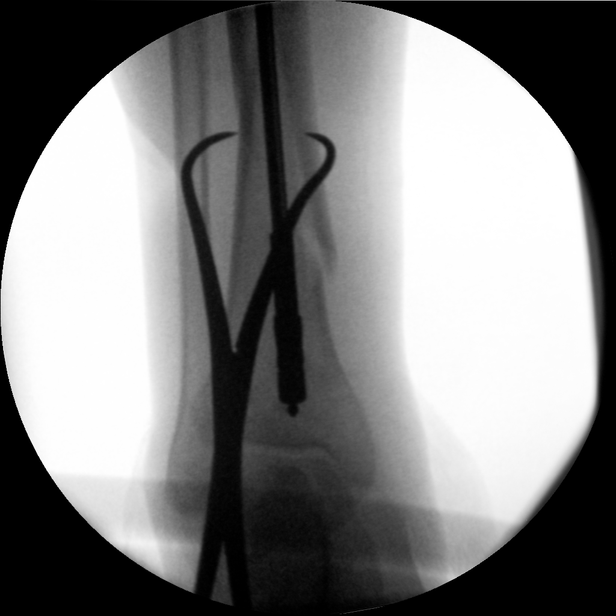

[15 of 24 positions shown; findings below may reference images not displayed]

FINDINGS: Thirty-five intraop images were obtained of ORIF intramedullary rod
fixation of the distal tibial fracture. Fluoro time 14 seconds
IMPRESSION: Intraop views of ORIF distal tibia.

## 2022-07-18 ENCOUNTER — Encounter: Payer: Self-pay | Admitting: Family Medicine

## 2022-07-18 ENCOUNTER — Ambulatory Visit
Admission: EM | Admit: 2022-07-18 | Discharge: 2022-07-18 | Disposition: A | Discharge status: Home / Self Care | Payer: Self-pay | Attending: Family Medicine | Admitting: Family Medicine

## 2022-07-18 ENCOUNTER — Other Ambulatory Visit: Payer: Self-pay

## 2022-07-18 DIAGNOSIS — Z87891 Personal history of nicotine dependence: Secondary | ICD-10-CM | POA: Insufficient documentation

## 2022-07-18 DIAGNOSIS — Z1152 Encounter for screening for COVID-19: Secondary | ICD-10-CM | POA: Insufficient documentation

## 2022-07-18 DIAGNOSIS — J069 Acute upper respiratory infection, unspecified: Secondary | ICD-10-CM | POA: Insufficient documentation

## 2022-07-18 LAB — RESP PANEL BY RT-PCR (RSV, FLU A&B, COVID)  RVPGX2
Influenza A by PCR: NEGATIVE
Influenza B by PCR: NEGATIVE
Resp Syncytial Virus by PCR: NEGATIVE
SARS Coronavirus 2 by RT PCR: NEGATIVE

## 2022-07-18 LAB — GROUP A STREP BY PCR: Group A Strep by PCR: NOT DETECTED

## 2022-07-18 NOTE — ED Provider Notes (Signed)
[facility]    CSN: [id number] Arrival date & time: 07/18/22  [id number]      History   Chief Complaint No chief complaint on file.   HPI Dale Harmon is a 32 y.o. male.   HPI   Dale Harmon presents for sore throat that started on Wednesday. Has sinus pressure, headache and fatigue. His symptoms are improving. He says it feels like he has strep or influenza. Needs a note to return to work.    Fever: undocumented  Chills: no Sore throat: yes  Cough: no Nasal congestion: yes Rhinorrhea: no Myalgias: no Appetite: normal  Hydration: normal  Chest pain: no  Shortness of breathe  Abdominal pain: no Nausea: no Vomiting: no Diarrhea: yes Rash: No Sleep disturbance: no Headache: yes       Past Medical History:  Diagnosis Date   COVID 12/11/2019    Patient Active Problem List   Diagnosis Date Noted   Fracture of tibial shaft, right, closed 03/03/2019   Psoriasis 12/28/2014   Acute onset aura migraine 12/26/2014   Episode of syncope 12/26/2014   Cephalalgia 10/31/2014    Past Surgical History:  Procedure Laterality Date   APPENDECTOMY     NO PAST SURGERIES     TIBIA IM NAIL INSERTION Right 03/04/2019   Procedure: INTRAMEDULLARY (IM) NAIL TIBIAL, RIGHT;  Surgeon: Lovell Sheehan, MD;  Location: [facility] ORS;  Service: Orthopedics;  Laterality: Right;       Home Medications    Prior to Admission medications   Not on File    Family History Family History  Problem Relation Age of Onset   Healthy Mother    Healthy Father     Social History Social History   Tobacco Use   Smoking status: Former    Types: Cigarettes    Quit date: 11/10/2014    Years since quitting: 7.6   Smokeless tobacco: Never  Substance Use Topics   Alcohol use: Yes    Alcohol/week: 0.0 standard drinks of alcohol    Comment: MODERATE USE   Drug use: No     Allergies   Amoxicillin and Penicillins   Review of Systems Review of Systems: negative unless otherwise  stated in HPI.      Physical Exam Triage Vital Signs ED Triage Vitals  Enc Vitals Group     BP 07/18/22 0837 (!) 143/78     Pulse Rate 07/18/22 0837 100     Resp 07/18/22 0837 20     Temp 07/18/22 0837 98.4 F (36.9 C)     Temp Source 07/18/22 0837 Oral     SpO2 07/18/22 0837 99 %     Weight --      Height --      Head Circumference --      Peak Flow --      Pain Score 07/18/22 0838 0     Pain Loc --      Pain Edu? --      Excl. in [place]? --    No data found.  Updated Vital Signs BP (!) 143/78   Pulse 100   Temp 98.4 F (36.9 C) (Oral)   Resp 20   SpO2 99%   Visual Acuity Right Eye Distance:   Left Eye Distance:   Bilateral Distance:    Right Eye Near:   Left Eye Near:    Bilateral Near:     Physical Exam GEN:     alert, non-ill appearing male in no distress  HENT:  mucus membranes moist, oropharyngeal without lesions or exudate, no tonsillar hypertrophy, moderate oropharyngeal erythema, no nasal discharge EYES:   pupils equal and reactive, no scleral injection or discharge NECK:  normal ROM, no lymphadenopathy, no meningismus   RESP:  no increased work of breathing, clear to auscultation bilaterally CVS:   regular rate and rhythm Skin:   warm and dry    UC Treatments / Results  Labs (all labs ordered are listed, but only abnormal results are displayed) Labs Reviewed  GROUP A STREP BY PCR  RESP PANEL BY RT-PCR (RSV, FLU A&B, COVID)  [id number]    EKG   Radiology No results found.  Procedures Procedures (including critical care time)  Medications Ordered in UC Medications - No data to display  Initial Impression / Assessment and Plan / UC Course  I have reviewed the triage vital signs and the nursing notes.  Pertinent labs & imaging results that were available during my care of the patient were reviewed by me and considered in my medical decision making (see chart for details).       Pt is a 32 y.o. male who presents for 2 days of  respiratory symptoms. Dale Harmon is afebrile here without recent antipyretics. Satting well on room air. Overall pt is non-toxic appearing, well hydrated, without respiratory distress. Pulmonary exam is unremarkable. Strep PCR, COVID and influenza testing obtained at patient's request. History most consistent with  viral respiratory illness. Discussed symptomatic treatment.  Explained lack of efficacy of antibiotics in viral disease.  Typical duration of symptoms discussed. Work note provided.   COVID, influenza and strep PCR are negative .  Return and ED precautions given and voiced understanding. Discussed MDM, treatment plan and plan for follow-up with patient who agrees with plan.     Final Clinical Impressions(s) / UC Diagnoses   Final diagnoses:  Viral URI     Discharge Instructions      We will contact you if your COVID/influenza test is positive.  Please quarantine while you wait for the results.  If your test is negative you may resume normal activities.  If your test is positive please continue to quarantine for at least 5 days from your symptom onset or until you are without a fever for at least 24 hours after the medications.    If your were prescribed medication, stop by the pharmacy to pick them up.   You can take Tylenol and/or Ibuprofen as needed for fever reduction and pain relief.   For sore throat: try warm salt water gargles, Mucinex sore throat cough drops or cepacol lozenges, throat spray, warm tea or water with lemon/honey, popsicles or ice, or OTC cold relief medicine for throat discomfort. You can also purchase chloraseptic spray at the pharmacy or dollar store.   For congestion: take a daily anti-histamine like Zyrtec, Claritin, and a oral decongestant, such as pseudoephedrine.  You can also use Flonase 1-2 sprays in each nostril daily. Afrin is also a good option, if you do not have high blood pressure.    It is important to stay hydrated: drink plenty of fluids  (water, gatorade/powerade/pedialyte, juices, or teas) to keep your throat moisturized and help further relieve irritation/discomfort.    Return or go to the Emergency Department if symptoms worsen or do not improve in the next few days      ED Prescriptions   None    PDMP not reviewed this encounter.   Lyndee Hensen, DO 07/18/22 [phone]

## 2022-07-18 NOTE — ED Triage Notes (Signed)
Pt started with sore throat on Wed the symptoms progressed. Nasal congestion fatigue, and headaches.

## 2022-07-18 NOTE — Discharge Instructions (Signed)
We will contact you if your COVID/influenza test is positive.  Please quarantine while you wait for the results.  If your test is negative you may resume normal activities.  If your test is positive please continue to quarantine for at least 5 days from your symptom onset or until you are without a fever for at least 24 hours after the medications.    If your were prescribed medication, stop by the pharmacy to pick them up.   You can take Tylenol and/or Ibuprofen as needed for fever reduction and pain relief.   For sore throat: try warm salt water gargles, Mucinex sore throat cough drops or cepacol lozenges, throat spray, warm tea or water with lemon/honey, popsicles or ice, or OTC cold relief medicine for throat discomfort. You can also purchase chloraseptic spray at the pharmacy or dollar store.   For congestion: take a daily anti-histamine like Zyrtec, Claritin, and a oral decongestant, such as pseudoephedrine.  You can also use Flonase 1-2 sprays in each nostril daily. Afrin is also a good option, if you do not have high blood pressure.    It is important to stay hydrated: drink plenty of fluids (water, gatorade/powerade/pedialyte, juices, or teas) to keep your throat moisturized and help further relieve irritation/discomfort.    Return or go to the Emergency Department if symptoms worsen or do not improve in the next few days
